# Patient Record
Sex: Male | Born: 1965 | Race: White | Hispanic: Yes | Marital: Married | Smoking: Current every day smoker
Health system: Southern US, Community
[De-identification: ages and names within clinical notes are randomized; demographics above are authoritative.]

## PROBLEM LIST (undated history)

## (undated) DIAGNOSIS — M5431 Sciatica, right side: Secondary | ICD-10-CM

## (undated) DIAGNOSIS — E119 Type 2 diabetes mellitus without complications: Secondary | ICD-10-CM

## (undated) DIAGNOSIS — E669 Obesity, unspecified: Secondary | ICD-10-CM

## (undated) DIAGNOSIS — E785 Hyperlipidemia, unspecified: Secondary | ICD-10-CM

## (undated) HISTORY — DX: Obesity, unspecified: E66.9

## (undated) HISTORY — DX: Hyperlipidemia, unspecified: E78.5

## (undated) HISTORY — PX: OTHER SURGICAL HISTORY: SHX169

## (undated) HISTORY — PX: APPENDECTOMY: SHX54

## (undated) HISTORY — PX: VASECTOMY: SHX75

## (undated) HISTORY — DX: Sciatica, right side: M54.31

---

## 2005-02-07 ENCOUNTER — Emergency Department (HOSPITAL_COMMUNITY): Admission: EM | Admit: 2005-02-07 | Discharge: 2005-02-07 | Payer: Self-pay | Admitting: Emergency Medicine

## 2005-07-21 ENCOUNTER — Emergency Department (HOSPITAL_COMMUNITY): Admission: EM | Admit: 2005-07-21 | Discharge: 2005-07-21 | Payer: Self-pay | Admitting: Emergency Medicine

## 2005-08-03 ENCOUNTER — Emergency Department (HOSPITAL_COMMUNITY): Admission: EM | Admit: 2005-08-03 | Discharge: 2005-08-03 | Payer: Self-pay | Admitting: Emergency Medicine

## 2005-08-13 ENCOUNTER — Emergency Department (HOSPITAL_COMMUNITY): Admission: EM | Admit: 2005-08-13 | Discharge: 2005-08-13 | Payer: Self-pay | Admitting: Emergency Medicine

## 2008-02-05 ENCOUNTER — Encounter: Admission: RE | Admit: 2008-02-05 | Discharge: 2008-02-05 | Payer: Self-pay | Admitting: Internal Medicine

## 2011-04-30 ENCOUNTER — Emergency Department (HOSPITAL_COMMUNITY)
Admission: EM | Admit: 2011-04-30 | Discharge: 2011-04-30 | Disposition: A | Discharge status: Home / Self Care | Payer: 59 | Attending: Emergency Medicine | Admitting: Emergency Medicine

## 2011-04-30 ENCOUNTER — Encounter (HOSPITAL_COMMUNITY): Payer: Self-pay

## 2011-04-30 ENCOUNTER — Emergency Department (HOSPITAL_COMMUNITY): Payer: 59

## 2011-04-30 DIAGNOSIS — R109 Unspecified abdominal pain: Secondary | ICD-10-CM | POA: Insufficient documentation

## 2011-04-30 DIAGNOSIS — R112 Nausea with vomiting, unspecified: Secondary | ICD-10-CM | POA: Insufficient documentation

## 2011-04-30 DIAGNOSIS — R319 Hematuria, unspecified: Secondary | ICD-10-CM | POA: Insufficient documentation

## 2011-04-30 DIAGNOSIS — N23 Unspecified renal colic: Secondary | ICD-10-CM | POA: Insufficient documentation

## 2011-04-30 LAB — URINALYSIS, ROUTINE W REFLEX MICROSCOPIC
Glucose, UA: NEGATIVE mg/dL
Leukocytes, UA: NEGATIVE
Specific Gravity, Urine: 1.023 (ref 1.005–1.030)
Urobilinogen, UA: 0.2 mg/dL (ref 0.0–1.0)

## 2011-04-30 LAB — URINE MICROSCOPIC-ADD ON

## 2011-04-30 MED ORDER — TAMSULOSIN HCL 0.4 MG PO CAPS: 0.4000 mg | ORAL_CAPSULE | Freq: Every day | ORAL | Status: DC

## 2011-04-30 MED ORDER — HYDROMORPHONE HCL PF 1 MG/ML IJ SOLN
1.0000 mg | Freq: Once | INTRAMUSCULAR | Status: AC
  Administered 2011-04-30: 1 mg via INTRAVENOUS
  Filled 2011-04-30: qty 1

## 2011-04-30 MED ORDER — KETOROLAC TROMETHAMINE 30 MG/ML IJ SOLN
INTRAMUSCULAR | Status: AC
  Administered 2011-04-30: 30 mg
  Filled 2011-04-30: qty 1

## 2011-04-30 MED ORDER — KETOROLAC TROMETHAMINE 30 MG/ML IJ SOLN
30.0000 mg | Freq: Once | INTRAMUSCULAR | Status: AC
  Administered 2011-04-30: 30 mg via INTRAVENOUS
  Filled 2011-04-30: qty 1

## 2011-04-30 MED ORDER — SODIUM CHLORIDE 0.9 % IV BOLUS (SEPSIS)
1000.0000 mL | Freq: Once | INTRAVENOUS | Status: AC
  Administered 2011-04-30: 1000 mL via INTRAVENOUS

## 2011-04-30 MED ORDER — ONDANSETRON HCL 4 MG/2ML IJ SOLN
4.0000 mg | Freq: Once | INTRAMUSCULAR | Status: AC
  Administered 2011-04-30: 4 mg via INTRAVENOUS
  Filled 2011-04-30: qty 2

## 2011-04-30 MED ORDER — ONDANSETRON HCL 4 MG PO TABS: 4.0000 mg | ORAL_TABLET | Freq: Four times a day (QID) | ORAL | Status: AC

## 2011-04-30 MED ORDER — OXYCODONE-ACETAMINOPHEN 5-325 MG PO TABS: 2.0000 | ORAL_TABLET | ORAL | Status: AC | PRN

## 2011-04-30 NOTE — ED Notes (Signed)
Pt. c/o LLQ pain and nausea that started yesterday AM . Pt. Denies Vomiting or diarrhea

## 2011-04-30 NOTE — ED Notes (Signed)
Pt alert and oriented x4. Respirations even and unlabored, bilateral symmetrical rise and fall of chest. Skin warm and dry. In no acute distress. Denies needs. Pt resting with eyes closed and lights off.

## 2011-04-30 NOTE — ED Provider Notes (Signed)
History     CSN: [id number]  Arrival date & time 04/30/11  7829   First MD Initiated Contact with Patient 04/30/11 0757      Chief Complaint  Patient presents with  . Abdominal Pain  . Nephrolithiasis    (Consider location/radiation/quality/duration/timing/severity/associated sxs/prior treatment) HPI  Patient with history of kidney stones.  STates blood in urine for 4-5 days.  Then pain left flank began yesterday.  Increased to 10 this a.m. With sharp cramps like prior kidney stone.  Radiates to llq.  Nausea and vomiting.  No fever or chills.  History of four prior stones with similar symptoms.    History reviewed. No pertinent past medical history. Kidney stones History reviewed. No pertinent past surgical history.  Family History  Problem Relation Age of Onset  . Diabetes Mother   . Hypertension Mother     History  Substance Use Topics  . Smoking status: Current Everyday Smoker -- 0.5 packs/day  . Smokeless tobacco: Not on file  . Alcohol Use: No      Review of Systems  All other systems reviewed and are negative.    Allergies  Review of patient's allergies indicates no known allergies.  Home Medications   Current Outpatient Rx  Name Route Sig Dispense Refill  . ADULT MULTIVITAMIN W/MINERALS CH Oral Take 1 tablet by mouth daily.      BP 120/71  Pulse 71  Temp(Src) 98.1 F (36.7 C) (Oral)  Wt 240 lb (108.863 kg)  SpO2 97%  Physical Exam  Nursing note and vitals reviewed. Constitutional: He is oriented to person, place, and time. He appears well-developed and well-nourished.  HENT:  Head: Normocephalic and atraumatic.  Right Ear: External ear normal.  Left Ear: External ear normal.  Nose: Nose normal.  Mouth/Throat: Oropharynx is clear and moist.  Eyes: Conjunctivae and EOM are normal. Pupils are equal, round, and reactive to light.  Neck: Normal range of motion. Neck supple.  Cardiovascular: Normal rate and regular rhythm.   Pulmonary/Chest:  Effort normal and breath sounds normal.  Abdominal: Soft. Bowel sounds are normal.  Musculoskeletal: Normal range of motion.  Neurological: He is alert and oriented to person, place, and time.  Skin: Skin is warm and dry.  Psychiatric: He has a normal mood and affect.    ED Course  Procedures (including critical care time)   Labs Reviewed  URINALYSIS, ROUTINE W REFLEX MICROSCOPIC   Dg Abd 1 View  04/30/2011  *RADIOLOGY REPORT*  Clinical Data: Left lower quadrant pain.  ABDOMEN - 1 VIEW  Comparison: 08/13/2005 and CT from 07/21/2005  Findings: Supine images of the abdomen were obtained.  There is a nonobstructive bowel gas pattern with stool predominately in the right and transverse colon.  No large abdominal calcifications.  No gross bony abnormality.  Cannot evaluate for free air on these supine images.  IMPRESSION: Nonobstructive bowel gas pattern.  Original Report Authenticated By: Richarda Overlie, M.D.     No diagnosis found.    MDM  Patient with 2 mm distal left UVJ stone. He has pain control here. He will be given antibiotics Flomax and pain medicine and instructed to follow urology        Hilario Quarry, MD 04/30/11 1406

## 2012-03-31 ENCOUNTER — Emergency Department (HOSPITAL_COMMUNITY): Payer: 59

## 2012-03-31 ENCOUNTER — Encounter (HOSPITAL_COMMUNITY): Payer: Self-pay | Admitting: *Deleted

## 2012-03-31 ENCOUNTER — Emergency Department (HOSPITAL_COMMUNITY)
Admission: EM | Admit: 2012-03-31 | Discharge: 2012-03-31 | Disposition: A | Discharge status: Home / Self Care | Payer: 59 | Attending: Emergency Medicine | Admitting: Emergency Medicine

## 2012-03-31 DIAGNOSIS — R0789 Other chest pain: Secondary | ICD-10-CM | POA: Insufficient documentation

## 2012-03-31 DIAGNOSIS — R002 Palpitations: Secondary | ICD-10-CM | POA: Insufficient documentation

## 2012-03-31 DIAGNOSIS — E119 Type 2 diabetes mellitus without complications: Secondary | ICD-10-CM | POA: Insufficient documentation

## 2012-03-31 DIAGNOSIS — F17200 Nicotine dependence, unspecified, uncomplicated: Secondary | ICD-10-CM | POA: Insufficient documentation

## 2012-03-31 DIAGNOSIS — R079 Chest pain, unspecified: Secondary | ICD-10-CM

## 2012-03-31 HISTORY — DX: Type 2 diabetes mellitus without complications: E11.9

## 2012-03-31 LAB — CBC WITH DIFFERENTIAL/PLATELET
Eosinophils Absolute: 0.3 10*3/uL (ref 0.0–0.7)
Eosinophils Relative: 3 % (ref 0–5)
HCT: 43.9 % (ref 39.0–52.0)
Lymphocytes Relative: 24 % (ref 12–46)
Lymphs Abs: 2 10*3/uL (ref 0.7–4.0)
MCHC: 36 g/dL (ref 30.0–36.0)
Monocytes Absolute: 0.6 10*3/uL (ref 0.1–1.0)
Monocytes Relative: 7 % (ref 3–12)
Neutrophils Relative %: 65 % (ref 43–77)
Platelets: 223 10*3/uL (ref 150–400)

## 2012-03-31 LAB — BASIC METABOLIC PANEL
CO2: 24 mEq/L (ref 19–32)
Chloride: 101 mEq/L (ref 96–112)
Creatinine, Ser: 0.61 mg/dL (ref 0.50–1.35)
GFR calc non Af Amer: >90 mL/min (ref >90)
Glucose, Bld: 229 mg/dL — ABNORMAL HIGH (ref 70–99)

## 2012-03-31 LAB — TROPONIN I: Troponin I: <0.3 ng/mL (ref <0.30)

## 2012-03-31 MED ORDER — ACETAMINOPHEN 325 MG PO TABS
650.0000 mg | ORAL_TABLET | Freq: Once | ORAL | Status: AC
  Administered 2012-03-31: 650 mg via ORAL
  Filled 2012-03-31: qty 2

## 2012-03-31 MED ORDER — TRAMADOL HCL 50 MG PO TABS: 50.0000 mg | ORAL_TABLET | Freq: Four times a day (QID) | ORAL | Status: DC | PRN

## 2012-03-31 MED ORDER — SODIUM CHLORIDE 0.9 % IV SOLN
Freq: Once | INTRAVENOUS | Status: AC
  Administered 2012-03-31: 20 mL/h via INTRAVENOUS

## 2012-03-31 NOTE — ED Provider Notes (Signed)
Medical screening examination/treatment/procedure(s) were performed by non-physician practitioner and as supervising physician I was immediately available for consultation/collaboration.  Flint Melter, MD 03/31/12 [phone]

## 2012-03-31 NOTE — ED Notes (Signed)
Pt reports had big argument at work yesterday morning-afterwards began to have some tightness in chest. States has had 3-4 episodes where he felt like heart was racing since yesterday. States pain central chest- feels tight. Denies shortness of breath/nausea/vomiting. Has had some diaphoresis at times.

## 2012-03-31 NOTE — ED Notes (Signed)
Pt sent from [facility] for eval where he went this am d/t chest pain. Per pt EKG was done at urgent care and he was told it was borderline. Pt sts he was given aspirin at urgent care. Pt c/o centralized chest pain that feels like heaviness and pressure and gets  worse with deep breath.

## 2012-03-31 NOTE — ED Provider Notes (Signed)
History     CSN: [id number]  Arrival date & time 03/31/12  1157   First MD Initiated Contact with Patient 03/31/12 1229      Chief Complaint  Patient presents with  . Chest Pain    (Consider location/radiation/quality/duration/timing/severity/associated sxs/prior treatment) Patient is a 46 y.o. male presenting with chest pain. The history is provided by the patient.  Chest Pain The chest pain began yesterday. Chest pain occurs constantly. The chest pain is unchanged. The pain is associated with breathing and exertion. The quality of the pain is described as heavy. The pain radiates to the left shoulder. Chest pain is worsened by certain positions, deep breathing and exertion. Primary symptoms include palpitations. Pertinent negatives for primary symptoms include no fever, no cough, no abdominal pain and no nausea.  Pertinent negatives for associated symptoms include no lower extremity edema and no weakness. Associated symptoms comments: He describes left sided chest heaviness since yesterday that gets worse with movement and deep breathing but does not completely resolve at any time. No fever or cough. He denies SOB but also that he feels he is having to breathe harder. No nausea or vomiting. He denies previous cardiology evaluation.Marland Kitchen He tried nothing for the symptoms.  His past medical history is significant for diabetes.  His family medical history is significant for CAD in family, hyperlipidemia in family and hypertension in family.     Past Medical History  Diagnosis Date  . Diabetes mellitus without complication     diet controlled    History reviewed. No pertinent past surgical history.  Family History  Problem Relation Age of Onset  . Diabetes Mother   . Hypertension Mother     History  Substance Use Topics  . Smoking status: Current Every Day Smoker -- 0.5 packs/day  . Smokeless tobacco: Not on file  . Alcohol Use: No      Review of Systems  Constitutional:  Negative for fever and chills.  HENT: Negative.   Respiratory: Negative.  Negative for cough.   Cardiovascular: Positive for chest pain and palpitations.  Gastrointestinal: Negative.  Negative for nausea and abdominal pain.  Musculoskeletal: Negative.   Skin: Negative.   Neurological: Negative.  Negative for weakness.    Allergies  Review of patient's allergies indicates no known allergies.  Home Medications   Current Outpatient Rx  Name  Route  Sig  Dispense  Refill  . IBUPROFEN 200 MG PO TABS   Oral   Take 600 mg by mouth once. pain         . ADULT MULTIVITAMIN W/MINERALS CH   Oral   Take 1 tablet by mouth daily.         Marland Kitchen TAMSULOSIN HCL 0.4 MG PO CAPS   Oral   Take 0.4 mg by mouth daily.         Marland Kitchen VITAMIN C 500 MG PO TABS   Oral   Take 500 mg by mouth daily.           BP 147/92  Pulse 77  Temp 98.2 F (36.8 C) (Oral)  Resp 16  SpO2 100%  Physical Exam  Constitutional: He is oriented to person, place, and time. He appears well-developed and well-nourished. No distress.  HENT:  Head: Normocephalic.  Mouth/Throat: Oropharynx is clear and moist.  Neck: Normal range of motion.  Cardiovascular: Normal rate and regular rhythm.   No murmur heard. Pulmonary/Chest: Effort normal. He has no wheezes. He has no rales. He exhibits no tenderness.  Abdominal: Soft. There is no tenderness. There is no rebound and no guarding.  Musculoskeletal: Normal range of motion. He exhibits no edema.  Neurological: He is alert and oriented to person, place, and time.  Skin: Skin is warm and dry. No rash noted.    ED Course  Procedures (including critical care time)   Labs Reviewed  CBC WITH DIFFERENTIAL  BASIC METABOLIC PANEL  TROPONIN I   Results for orders placed during the hospital encounter of 03/31/12  CBC WITH DIFFERENTIAL      Component Value Range   WBC 8.2  4.0 - 10.5 K/uL   RBC 5.07  4.22 - 5.81 MIL/uL   Hemoglobin 15.8  13.0 - 17.0 g/dL   HCT 04.5   40.9 - 81.1 %   MCV 86.6  78.0 - 100.0 fL   MCH 31.2  26.0 - 34.0 pg   MCHC 36.0  30.0 - 36.0 g/dL   RDW 91.4  78.2 - 95.6 %   Platelets 223  150 - 400 K/uL   Neutrophils Relative 65  43 - 77 %   Neutro Abs 5.3  1.7 - 7.7 K/uL   Lymphocytes Relative 24  12 - 46 %   Lymphs Abs 2.0  0.7 - 4.0 K/uL   Monocytes Relative 7  3 - 12 %   Monocytes Absolute 0.6  0.1 - 1.0 K/uL   Eosinophils Relative 3  0 - 5 %   Eosinophils Absolute 0.3  0.0 - 0.7 K/uL   Basophils Relative 1  0 - 1 %   Basophils Absolute 0.1  0.0 - 0.1 K/uL  BASIC METABOLIC PANEL      Component Value Range   Sodium 135  135 - 145 mEq/L   Potassium 4.1  3.5 - 5.1 mEq/L   Chloride 101  96 - 112 mEq/L   CO2 24  19 - 32 mEq/L   Glucose, Bld 229 (*) 70 - 99 mg/dL   BUN 13  6 - 23 mg/dL   Creatinine, Ser 2.13  0.50 - 1.35 mg/dL   Calcium 9.3  8.4 - 08.6 mg/dL   GFR calc non Af Amer >90  >90 mL/min   GFR calc Af Amer >90  >90 mL/min  TROPONIN I      Component Value Range   Troponin I <0.30  <0.30 ng/mL  TROPONIN I      Component Value Range   Troponin I <0.30  <0.30 ng/mL   Dg Chest 2 View  03/31/2012  *RADIOLOGY REPORT*  Clinical Data: Chest pain.  CHEST - 2 VIEW  Comparison: None.  Findings: Mild interstitial prominence.  No confluent airspace opacity.  No pleural effusion or pneumothorax.  Cardiomediastinal contours within normal range. No acute osseous finding.  IMPRESSION: Mild interstitial prominence may be chronic versus an atypical or viral infection.   Original Report Authenticated By: Jearld Lesch, M.D.   No results found.  Date: 03/31/2012  Rate: 77  Rhythm: normal sinus rhythm  QRS Axis: left  Intervals: normal  ST/T Wave abnormalities: normal  Conduction Disutrbances:none  Narrative Interpretation:   Old EKG Reviewed: none available    No diagnosis found. 1. Chest pain   MDM  The patient has been comfortable while in ED. Discomfort still present with movement. No change. Doubt ACS - normal  multiple troponins, nonacute EKG. Stable for discharge.         Rodena Medin, PA-C 03/31/12 1700

## 2012-03-31 NOTE — Discharge Instructions (Signed)
Your lab studies and x-ray are essentially normal, without any evidence to support a heart attack. You can be discharged home and should follow up with your doctor for further evaluation if symptoms persist. Recommend ibuprofen for discomfort, warm compresses. Return here with any severe pain or shortness of breath, high fever, or new concerns.   Chest Pain (Nonspecific) It is often hard to give a specific diagnosis for the cause of chest pain. There is always a chance that your pain could be related to something serious, such as a heart attack or a blood clot in the lungs. You need to follow up with your caregiver for further evaluation. CAUSES   Heartburn.  Pneumonia or bronchitis.  Anxiety or stress.  Inflammation around your heart (pericarditis) or lung (pleuritis or pleurisy).  A blood clot in the lung.  A collapsed lung (pneumothorax). It can develop suddenly on its own (spontaneous pneumothorax) or from injury (trauma) to the chest.  Shingles infection (herpes zoster virus). The chest wall is composed of bones, muscles, and cartilage. Any of these can be the source of the pain.  The bones can be bruised by injury.  The muscles or cartilage can be strained by coughing or overwork.  The cartilage can be affected by inflammation and become sore (costochondritis). DIAGNOSIS  Lab tests or other studies, such as X-rays, electrocardiography, stress testing, or cardiac imaging, may be needed to find the cause of your pain.  TREATMENT   Treatment depends on what may be causing your chest pain. Treatment may include:  Acid blockers for heartburn.  Anti-inflammatory medicine.  Pain medicine for inflammatory conditions.  Antibiotics if an infection is present.  You may be advised to change lifestyle habits. This includes stopping smoking and avoiding alcohol, caffeine, and chocolate.  You may be advised to keep your head raised (elevated) when sleeping. This reduces the chance of  acid going backward from your stomach into your esophagus.  Most of the time, nonspecific chest pain will improve within 2 to 3 days with rest and mild pain medicine. HOME CARE INSTRUCTIONS   If antibiotics were prescribed, take your antibiotics as directed. Finish them even if you start to feel better.  For the next few days, avoid physical activities that bring on chest pain. Continue physical activities as directed.  Do not smoke.  Avoid drinking alcohol.  Only take over-the-counter or prescription medicine for pain, discomfort, or fever as directed by your caregiver.  Follow your caregiver's suggestions for further testing if your chest pain does not go away.  Keep any follow-up appointments you made. If you do not go to an appointment, you could develop lasting (chronic) problems with pain. If there is any problem keeping an appointment, you must call to reschedule. SEEK MEDICAL CARE IF:   You think you are having problems from the medicine you are taking. Read your medicine instructions carefully.  Your chest pain does not go away, even after treatment.  You develop a rash with blisters on your chest. SEEK IMMEDIATE MEDICAL CARE IF:   You have increased chest pain or pain that spreads to your arm, neck, jaw, back, or abdomen.  You develop shortness of breath, an increasing cough, or you are coughing up blood.  You have severe back or abdominal pain, feel nauseous, or vomit.  You develop severe weakness, fainting, or chills.  You have a fever. THIS IS AN EMERGENCY. Do not wait to see if the pain will go away. Get medical help at once.  Call your local emergency services (911 in [place] Do not drive yourself to the hospital. MAKE SURE YOU:   Understand these instructions.  Will watch your condition.  Will get help right away if you are not doing well or get worse. Document Released: 01/13/2005 Document Revised: 06/28/2011 Document Reviewed: 11/09/2007 [facility] Patient  Information 2013 [place], [place].

## 2014-07-03 ENCOUNTER — Other Ambulatory Visit: Payer: Self-pay | Admitting: Internal Medicine

## 2014-07-03 DIAGNOSIS — M545 Low back pain: Secondary | ICD-10-CM

## 2014-07-03 DIAGNOSIS — M5412 Radiculopathy, cervical region: Secondary | ICD-10-CM

## 2014-07-10 ENCOUNTER — Ambulatory Visit
Admission: RE | Admit: 2014-07-10 | Discharge: 2014-07-10 | Disposition: A | Discharge status: Home / Self Care | Payer: 59 | Source: Ambulatory Visit | Attending: Internal Medicine | Admitting: Internal Medicine

## 2014-07-10 DIAGNOSIS — M545 Low back pain: Secondary | ICD-10-CM

## 2014-07-10 DIAGNOSIS — M5412 Radiculopathy, cervical region: Secondary | ICD-10-CM

## 2014-07-11 ENCOUNTER — Other Ambulatory Visit: Payer: Self-pay | Admitting: Internal Medicine

## 2014-07-11 DIAGNOSIS — I6509 Occlusion and stenosis of unspecified vertebral artery: Secondary | ICD-10-CM

## 2014-07-18 ENCOUNTER — Ambulatory Visit
Admission: RE | Admit: 2014-07-18 | Discharge: 2014-07-18 | Disposition: A | Discharge status: Home / Self Care | Payer: 59 | Source: Ambulatory Visit | Attending: Internal Medicine | Admitting: Internal Medicine

## 2014-07-18 DIAGNOSIS — I6509 Occlusion and stenosis of unspecified vertebral artery: Secondary | ICD-10-CM

## 2014-07-18 MED ORDER — IOPAMIDOL (ISOVUE-370) INJECTION 76%
80.0000 mL | Freq: Once | INTRAVENOUS | Status: AC | PRN
  Administered 2014-07-18: 80 mL via INTRAVENOUS

## 2014-11-28 ENCOUNTER — Encounter: Payer: Self-pay | Admitting: Neurology

## 2014-11-28 ENCOUNTER — Ambulatory Visit (INDEPENDENT_AMBULATORY_CARE_PROVIDER_SITE_OTHER): Payer: 59 | Admitting: Neurology

## 2014-11-28 VITALS — BP 138/90 | HR 86 | Resp 20 | Ht 68.0 in | Wt 230.0 lb

## 2014-11-28 DIAGNOSIS — R0683 Snoring: Secondary | ICD-10-CM

## 2014-11-28 DIAGNOSIS — G4710 Hypersomnia, unspecified: Secondary | ICD-10-CM

## 2014-11-28 DIAGNOSIS — G4730 Sleep apnea, unspecified: Secondary | ICD-10-CM

## 2014-11-28 NOTE — Patient Instructions (Signed)
Sleep Apnea  Sleep apnea is disorder that affects a person's sleep. A person with sleep apnea has abnormal pauses in their breathing when they sleep. It is hard for them to get a good sleep. This makes a person tired during the day. It also can lead to other physical problems. There are three types of sleep apnea. One type is when breathing stops for a short time because your airway is blocked (obstructive sleep apnea). Another type is when the brain sometimes fails to give the normal signal to breathe to the muscles that control your breathing (central sleep apnea). The third type is a combination of the other two types.  HOME CARE  · Do not sleep on your back. Try to sleep on your side.  · Take all medicine as told by your doctor.  · Avoid alcohol, calming medicines (sedatives), and depressant drugs.  · Try to lose weight if you are overweight. Talk to your doctor about a healthy weight goal.  Your doctor may have you use a device that helps to open your airway. It can help you get the air that you need. It is called a positive airway pressure (PAP) device. There are three types of PAP devices:  · Continuous positive airway pressure (CPAP) device.  · Nasal expiratory positive airway pressure (EPAP) device.  · Bilevel positive airway pressure (BPAP) device.  MAKE SURE YOU:  · Understand these instructions.  · Will watch your condition.  · Will get help right away if you are not doing well or get worse.  Document Released: 01/13/2008 Document Revised: 03/22/2012 Document Reviewed: 08/07/2011  [employer]® Patient Information ©2015 [employer]. This information is not intended to replace advice given to you by your health care provider. Make sure you discuss any questions you have with your health care provider.

## 2014-11-28 NOTE — Progress Notes (Signed)
SLEEP MEDICINE CLINIC   Provider:  Melvyn Novas, M D  Referring Provider: Martha Clan, MD Primary Care Physician:  Martha Clan, MD  Chief Complaint  Patient presents with  . sleep consult    snoring, excessive sleepiness during day, wife says that pt stops breathing, rm 10, alone    HPI:  Jeffrey Welch is a [place]  49 y.o. male , seen here as a referral from Dr. Clelia Croft for a sleep evaluation ,  Chief complaint according to patient : " i am excessively sleepy"  Jeffrey Welch reports that his spouse has observed him to snore very loudly and she has witnessed apneas. Sometimes in the morning when he wakes up his mouth is parched and dry and occasionally at night he wakes up with a choking sensation as if he swallows his tongue. He has never before been evaluated for sleep apnea. He has been gaining and losing weight over the last 6 months he lost about 25 pounds but he has been heavier than he has been 5 years ago. Jeffrey Welch describes his sleep habits as follows: He usually goes to bed around 11 PM, he states he falls asleep like a rock anywhere and so does promptly at night. But he wakes up frequently from sleep and his sleep is fragmented not restorative and not refreshing. His bedroom is cool, quiet and dark and he shares that with his wife who has observed his apneas. He likes to sleep on his side which also seems to reduce his snoring and frequency of apneas. He usually has no need to go to the bathroom at night. He rises in the morning around 6 AM and wakes up spontaneously. He feels tired not refreshed not restored even after 7 hours of sleep. Most nights he will sleep much less.  In the morning he will drink some coffee and during the day will add another 8 cups. He drinks Bustello. He does not plan any naps during daytime but he has fallen asleep when not stimulated or not physically active. He has trouble not falling asleep fighting not falling asleep when he sits  at the computer. His work is office-based, he reads and works on [employer], and is deprived of natural daylight. He feels naps are not helpful, he wouldn't sleep only 15 or 30 minutes.  He has tried memory foam mattresses , pillows,  wedges but he cannot get a good night of restful sounds sleep.    Sleep medical history and family sleep history: His father and mother are snores. No diagnosis of apnea.   Social history: daytime jobs, office , same shift for a couple of years.  He is a smoker, and caffeine drinker, he drinks socially alcohol. 2-3 glasses a month.   Review of Systems: Out of a complete 14 system review, the patient complains of only the following symptoms, and all other reviewed systems are negative. Snoring, weight gain, witnessed apneas, Epworth sleepiness score is endorsed at 17 points, fatigue severity score not endorsed,   Social History   Social History  . Marital Status: Married    Spouse Name: N/A  . Number of Children: N/A  . Years of Education: N/A   Occupational History  . Not on file.   Social History Main Topics  . Smoking status: Current Every Day Smoker -- 0.25 packs/day for 25 years    Types: Cigarettes  . Smokeless tobacco: Not on file  . Alcohol Use: 0.0 oz/week    0 Standard  drinks or equivalent per week     Comment: rarely  . Drug Use: No  . Sexual Activity: Not on file   Other Topics Concern  . Not on file   Social History Narrative   Drinks 9 cups of coffee daily.    Family History  Problem Relation Age of Onset  . Diabetes Mother   . Hypertension Mother     Past Medical History  Diagnosis Date  . Diabetes mellitus without complication     diet controlled  . Hyperlipidemia   . Obesity   . Sciatica of right side     Past Surgical History  Procedure Laterality Date  . Vasectomy    . Appendectomy    . Anterior cervical surgery      Current Outpatient Prescriptions  Medication Sig Dispense Refill  . aspirin 81 MG  tablet Take 81 mg by mouth daily.    . cyclobenzaprine (FLEXERIL) 10 MG tablet Take 10 mg by mouth 3 (three) times daily as needed for muscle spasms.    Marland Kitchen HYDROcodone-acetaminophen (NORCO/VICODIN) 5-325 MG per tablet Take 1 tablet by mouth every 6 (six) hours as needed for moderate pain.    Marland Kitchen ibuprofen (ADVIL,MOTRIN) 200 MG tablet Take 600 mg by mouth once. pain    . metFORMIN (GLUCOPHAGE) 1000 MG tablet Take 1,000 mg by mouth 2 (two) times daily with a meal.    . Multiple Vitamin (MULITIVITAMIN WITH MINERALS) TABS Take 1 tablet by mouth daily.    . simvastatin (ZOCOR) 20 MG tablet Take 20 mg by mouth daily.    . Tamsulosin HCl (FLOMAX) 0.4 MG CAPS Take 0.4 mg by mouth daily.    . traMADol (ULTRAM) 50 MG tablet Take 1 tablet (50 mg total) by mouth every 6 (six) hours as needed for pain. 15 tablet 0  . vitamin C (ASCORBIC ACID) 500 MG tablet Take 500 mg by mouth daily.     No current facility-administered medications for this visit.    Allergies as of 11/28/2014  . (No Known Allergies)    Vitals: BP 138/90 mmHg  Pulse 86  Resp 20  Ht  (1.727 m)  Wt 230 lb (104.327 kg)  BMI 34.98 kg/m2 Last Weight:  Wt Readings from Last 1 Encounters:  11/28/14 230 lb (104.327 kg)   ZOX:WRUE mass index is 34.98 kg/(m^2).     Last Height:   Ht Readings from Last 1 Encounters:  11/28/14  (1.727 m)    Physical exam:  General: The patient is awake, alert and appears not in acute distress. The patient is well groomed. Head: Normocephalic, atraumatic. Neck is supple. Mallampati 3 with an elongated and swollen uvula. ,  neck circumference: 18 .  Anterior fusion scar is noted on the neck but is well healing, surgery occurred on 09/04/2014. Dr. Jeral Fruit   Nasal airflow unrestricted, TMJ click is evident . Retrognathia is not seen.  Cardiovascular:  Regular rate and rhythm, without  murmurs or carotid bruit, and without distended neck veins. Respiratory: Lungs are clear to auscultation. Skin:   Without evidence of edema, or rash Trunk: BMI is elevated . The patient's posture is normal, he keeps his left elbow flexed.  Neurologic exam : The patient is awake and alert, oriented to place and time.   Memory subjective described as intact.  Attention span & concentration ability appears normal.  Speech is fluent,  without dysarthria, dysphonia or aphasia.  Mood and affect are appropriate.  Cranial nerves: Pupils are equal  and briskly reactive to light. Funduscopic exam without  evidence of pallor or edema.  Extraocular movements  in vertical and horizontal planes intact and without nystagmus. Visual fields by finger perimetry are intact. Hearing to finger rub intact. Facial sensation intact to fine touch. Facial motor strength is symmetric and tongue and uvula move midline. Shoulder shrug was symmetrical.   Motor exam: Normal tone, muscle bulk , but loss of  strength in left upper extremity.  Elbow pain, status post cortisone shot. ROM restricted   Sensory:  Fine touch, pinprick and vibration were tested in all extremities.  Proprioception tested in the upper extremities was normal.  Coordination: Rapid alternating movements in the fingers/hands was normal.  Finger-to-nose maneuver  normal without evidence of ataxia, dysmetria or tremor.  Gait and station: Patient walks without assistive device and is able unassisted to climb up to the exam table. Strength within normal limits.  Stance is stable and normal. Toe and hell stand were tested.  Deep tendon reflexes: in the upper and lower extremities are symmetric and intact. Babinski maneuver response is downgoing.  The patient was advised of the nature of the diagnosed sleep disorder , the treatment options and risks for general a health and wellness arising from not treating the condition.  I spent more than 35 minutes of face to face time with the patient. Greater than 50% of time was spent in counseling and coordination of care. We  have discussed the diagnosis and differential and I answered the patient's questions.     Assessment:  After physical and neurologic examination, review of laboratory studies,  Personal review of imaging studies, reports of other /same  Imaging studies ,  Results of polysomnography/ neurophysiology testing and pre-existing records as far as provided in visit., my assessment is   1) Jeffrey Welch has all the risk factors for obstructive sleep apnea including the body mass index a shorter thicker neck, and elongated uvula and the clinical observation of snoring and apnea being witnessed.  2) the main risk factor he can influence his weight, he has been losing weight over the last 6 months or so and hopes to continue this.  3) his recent anterior cervical fusion performed by Dr. Jeral Fruit was managed to help him with left upper extremity strength grip strengths and range of motion as well as neck tightness. The patient has no swallowing difficulties since his surgery he also states that he is not yet feeling a return to normal strength on his left upper extremity. I would recommend for him to sleep in lateral recumbent position instead of supine if tolerated. He may have to resume this on the right shoulder since the left elbow and shoulder are in pain.      Plan:  Treatment plan and additional workup : Split night polysomnography ordered with an AHI of 20 and a desaturation score of 4%. The patient feels no high per fatigue and he does not have morning headaches. We will not need hypercapnia measurements. Rv after sleep study with me. 30 minutes , please.   CC Dr Wayne Both Jaysten Essner MD  11/28/2014   CC: Martha Clan, Md [address] [place], [place] [address]

## 2015-01-19 ENCOUNTER — Ambulatory Visit (INDEPENDENT_AMBULATORY_CARE_PROVIDER_SITE_OTHER): Payer: 59 | Admitting: Neurology

## 2015-01-19 DIAGNOSIS — G4710 Hypersomnia, unspecified: Secondary | ICD-10-CM | POA: Diagnosis not present

## 2015-01-19 DIAGNOSIS — R0683 Snoring: Secondary | ICD-10-CM

## 2015-01-19 DIAGNOSIS — G4730 Sleep apnea, unspecified: Secondary | ICD-10-CM | POA: Diagnosis not present

## 2015-01-20 NOTE — Sleep Study (Signed)
Please see the scanned sleep study interpretation located in the Procedure tab within the Chart Review section. 

## 2015-01-21 ENCOUNTER — Telehealth: Payer: Self-pay

## 2015-01-21 NOTE — Telephone Encounter (Signed)
Called pt to give his sleep study results. Left message asking him to call me back.

## 2015-01-22 NOTE — Telephone Encounter (Signed)
Spoke to pt regarding his sleep study results. Advised pt that his study revealed only snoring and that therapies for snoring include an oral appliance or an ENT evaluation/procedure. I advised weight loss and positional therapy as well.  I also advised pt that there was significant periodic limb movements of sleep results in significant RLS or PLM disorder. I advised pt that I could make him an appt with Dr. Vickey Huger to discuss all of this information and treatment options. Pt declined an appt at this time. Pt said he would call back if he decided he wanted another appt. Pt wished for me to fax these results to Dr. Clelia Croft. Results faxed.

## 2015-08-18 ENCOUNTER — Encounter (HOSPITAL_COMMUNITY): Payer: Self-pay | Admitting: Internal Medicine

## 2015-08-18 ENCOUNTER — Telehealth: Payer: Self-pay | Admitting: Internal Medicine

## 2015-08-18 ENCOUNTER — Inpatient Hospital Stay (HOSPITAL_COMMUNITY): Payer: 59

## 2015-08-18 ENCOUNTER — Inpatient Hospital Stay (HOSPITAL_COMMUNITY)
Admission: AD | Admit: 2015-08-18 | Discharge: 2015-08-21 | DRG: 872 | Disposition: A | Discharge status: Home / Self Care | Payer: 59 | Source: Ambulatory Visit | Attending: Internal Medicine | Admitting: Internal Medicine

## 2015-08-18 DIAGNOSIS — E1165 Type 2 diabetes mellitus with hyperglycemia: Secondary | ICD-10-CM | POA: Diagnosis present

## 2015-08-18 DIAGNOSIS — A045 Campylobacter enteritis: Secondary | ICD-10-CM | POA: Diagnosis present

## 2015-08-18 DIAGNOSIS — R945 Abnormal results of liver function studies: Secondary | ICD-10-CM

## 2015-08-18 DIAGNOSIS — R509 Fever, unspecified: Secondary | ICD-10-CM | POA: Diagnosis present

## 2015-08-18 DIAGNOSIS — E876 Hypokalemia: Secondary | ICD-10-CM | POA: Diagnosis present

## 2015-08-18 DIAGNOSIS — W57XXXA Bitten or stung by nonvenomous insect and other nonvenomous arthropods, initial encounter: Secondary | ICD-10-CM | POA: Diagnosis present

## 2015-08-18 DIAGNOSIS — E869 Volume depletion, unspecified: Secondary | ICD-10-CM | POA: Diagnosis present

## 2015-08-18 DIAGNOSIS — Z7984 Long term (current) use of oral hypoglycemic drugs: Secondary | ICD-10-CM | POA: Diagnosis not present

## 2015-08-18 DIAGNOSIS — Z833 Family history of diabetes mellitus: Secondary | ICD-10-CM | POA: Diagnosis not present

## 2015-08-18 DIAGNOSIS — F17210 Nicotine dependence, cigarettes, uncomplicated: Secondary | ICD-10-CM | POA: Diagnosis present

## 2015-08-18 DIAGNOSIS — Z79899 Other long term (current) drug therapy: Secondary | ICD-10-CM | POA: Diagnosis not present

## 2015-08-18 DIAGNOSIS — E785 Hyperlipidemia, unspecified: Secondary | ICD-10-CM | POA: Diagnosis present

## 2015-08-18 DIAGNOSIS — Z8249 Family history of ischemic heart disease and other diseases of the circulatory system: Secondary | ICD-10-CM | POA: Diagnosis not present

## 2015-08-18 DIAGNOSIS — D696 Thrombocytopenia, unspecified: Secondary | ICD-10-CM | POA: Diagnosis present

## 2015-08-18 DIAGNOSIS — R6510 Systemic inflammatory response syndrome (SIRS) of non-infectious origin without acute organ dysfunction: Secondary | ICD-10-CM | POA: Diagnosis not present

## 2015-08-18 DIAGNOSIS — E871 Hypo-osmolality and hyponatremia: Secondary | ICD-10-CM | POA: Diagnosis present

## 2015-08-18 DIAGNOSIS — R7989 Other specified abnormal findings of blood chemistry: Secondary | ICD-10-CM | POA: Diagnosis present

## 2015-08-18 DIAGNOSIS — D6959 Other secondary thrombocytopenia: Secondary | ICD-10-CM | POA: Diagnosis present

## 2015-08-18 DIAGNOSIS — A419 Sepsis, unspecified organism: Principal | ICD-10-CM

## 2015-08-18 DIAGNOSIS — L539 Erythematous condition, unspecified: Secondary | ICD-10-CM | POA: Diagnosis present

## 2015-08-18 LAB — BASIC METABOLIC PANEL
Anion gap: 11 (ref 5–15)
BUN: 14 mg/dL (ref 6–20)
CHLORIDE: 91 mmol/L — AB (ref 101–111)
CO2: 27 mmol/L (ref 22–32)
Calcium: 8.8 mg/dL — ABNORMAL LOW (ref 8.9–10.3)
Creatinine, Ser: 1.02 mg/dL (ref 0.61–1.24)
GFR calc Af Amer: >60 mL/min (ref >60)
Glucose, Bld: 216 mg/dL — ABNORMAL HIGH (ref 65–99)
Potassium: 3.7 mmol/L (ref 3.5–5.1)
SODIUM: 129 mmol/L — AB (ref 135–145)

## 2015-08-18 LAB — CBC WITH DIFFERENTIAL/PLATELET
BASOS PCT: 2 %
Basophils Absolute: 0.2 10*3/uL — ABNORMAL HIGH (ref 0.0–0.1)
EOS ABS: 0 10*3/uL (ref 0.0–0.7)
Eosinophils Relative: 0 %
HCT: 43.8 % (ref 39.0–52.0)
Hemoglobin: 16.1 g/dL (ref 13.0–17.0)
Lymphocytes Relative: 20 %
Lymphs Abs: 1.6 10*3/uL (ref 0.7–4.0)
MCH: 31.1 pg (ref 26.0–34.0)
MCHC: 36.8 g/dL — ABNORMAL HIGH (ref 30.0–36.0)
MCV: 84.6 fL (ref 78.0–100.0)
MONO ABS: 0.9 10*3/uL (ref 0.1–1.0)
Monocytes Relative: 11 %
NEUTROS ABS: 5.3 10*3/uL (ref 1.7–7.7)
Neutrophils Relative %: 67 %
PLATELETS: 60 10*3/uL — AB (ref 150–400)
RBC: 5.18 MIL/uL (ref 4.22–5.81)
RDW: 13.2 % (ref 11.5–15.5)
WBC: 8 10*3/uL (ref 4.0–10.5)

## 2015-08-18 LAB — URINE MICROSCOPIC-ADD ON
BACTERIA UA: NONE SEEN
RBC / HPF: NONE SEEN RBC/hpf (ref 0–5)

## 2015-08-18 LAB — RAPID URINE DRUG SCREEN, HOSP PERFORMED
AMPHETAMINES: NOT DETECTED
BENZODIAZEPINES: POSITIVE — AB
Barbiturates: NOT DETECTED
COCAINE: NOT DETECTED
Opiates: NOT DETECTED
Tetrahydrocannabinol: POSITIVE — AB

## 2015-08-18 LAB — URINALYSIS, ROUTINE W REFLEX MICROSCOPIC
GLUCOSE, UA: NEGATIVE mg/dL
Hgb urine dipstick: NEGATIVE
KETONES UR: NEGATIVE mg/dL
LEUKOCYTES UA: NEGATIVE
NITRITE: NEGATIVE
PROTEIN: 30 mg/dL — AB
Specific Gravity, Urine: 1.025 (ref 1.005–1.030)
pH: 6 (ref 5.0–8.0)

## 2015-08-18 LAB — HEPATIC FUNCTION PANEL
ALBUMIN: 3.4 g/dL — AB (ref 3.5–5.0)
ALK PHOS: 83 U/L (ref 38–126)
ALT: 161 U/L — AB (ref 17–63)
AST: 115 U/L — ABNORMAL HIGH (ref 15–41)
BILIRUBIN TOTAL: 1.4 mg/dL — AB (ref 0.3–1.2)
Bilirubin, Direct: 0.6 mg/dL — ABNORMAL HIGH (ref 0.1–0.5)
Indirect Bilirubin: 0.8 mg/dL (ref 0.3–0.9)
Total Protein: 6.5 g/dL (ref 6.5–8.1)

## 2015-08-18 LAB — LACTATE DEHYDROGENASE: LDH: 321 U/L — ABNORMAL HIGH (ref 98–192)

## 2015-08-18 LAB — SEDIMENTATION RATE: Sed Rate: 6 mm/hr (ref 0–16)

## 2015-08-18 LAB — PROCALCITONIN: Procalcitonin: 0.58 ng/mL

## 2015-08-18 LAB — LACTIC ACID, PLASMA: LACTIC ACID, VENOUS: 2.6 mmol/L — AB (ref 0.5–2.0)

## 2015-08-18 LAB — ACETAMINOPHEN LEVEL: Acetaminophen (Tylenol), Serum: <10 ug/mL — ABNORMAL LOW (ref 10–30)

## 2015-08-18 MED ORDER — SODIUM CHLORIDE 0.9 % IV SOLN: INTRAVENOUS | Status: DC

## 2015-08-18 MED ORDER — ACETAMINOPHEN 325 MG PO TABS
650.0000 mg | ORAL_TABLET | Freq: Four times a day (QID) | ORAL | Status: DC | PRN
  Administered 2015-08-20 (×2): 650 mg via ORAL
  Filled 2015-08-18 (×2): qty 2

## 2015-08-18 MED ORDER — PIPERACILLIN-TAZOBACTAM 3.375 G IVPB 30 MIN
3.3750 g | Freq: Once | INTRAVENOUS | Status: AC
  Administered 2015-08-18: 3.375 g via INTRAVENOUS
  Filled 2015-08-18: qty 50

## 2015-08-18 MED ORDER — ACETAMINOPHEN 650 MG RE SUPP
650.0000 mg | Freq: Four times a day (QID) | RECTAL | Status: DC | PRN
Start: 2015-08-18 — End: 2015-08-21

## 2015-08-18 MED ORDER — ONDANSETRON HCL 4 MG PO TABS: 4.0000 mg | ORAL_TABLET | Freq: Four times a day (QID) | ORAL | Status: DC | PRN

## 2015-08-18 MED ORDER — ONDANSETRON HCL 4 MG/2ML IJ SOLN: 4.0000 mg | Freq: Four times a day (QID) | INTRAMUSCULAR | Status: DC | PRN

## 2015-08-18 MED ORDER — PIPERACILLIN-TAZOBACTAM 3.375 G IVPB
3.3750 g | Freq: Three times a day (TID) | INTRAVENOUS | Status: DC
  Administered 2015-08-19 – 2015-08-20 (×4): 3.375 g via INTRAVENOUS
  Filled 2015-08-18 (×5): qty 50

## 2015-08-18 MED ORDER — VANCOMYCIN HCL IN DEXTROSE 1-5 GM/200ML-% IV SOLN: 1000.0000 mg | Freq: Once | INTRAVENOUS | Status: DC

## 2015-08-18 MED ORDER — INSULIN ASPART 100 UNIT/ML ~~LOC~~ SOLN
0.0000 [IU] | Freq: Three times a day (TID) | SUBCUTANEOUS | Status: DC
  Administered 2015-08-19: 1 [IU] via SUBCUTANEOUS
  Administered 2015-08-19: 3 [IU] via SUBCUTANEOUS
  Administered 2015-08-20 (×2): 2 [IU] via SUBCUTANEOUS
  Administered 2015-08-20: 3 [IU] via SUBCUTANEOUS
  Administered 2015-08-21: 1 [IU] via SUBCUTANEOUS

## 2015-08-18 MED ORDER — DEXTROSE 5 % IV SOLN
100.0000 mg | Freq: Two times a day (BID) | INTRAVENOUS | Status: DC
  Administered 2015-08-18 – 2015-08-20 (×4): 100 mg via INTRAVENOUS
  Filled 2015-08-18 (×5): qty 100

## 2015-08-18 MED ORDER — VANCOMYCIN HCL IN DEXTROSE 750-5 MG/150ML-% IV SOLN
750.0000 mg | Freq: Two times a day (BID) | INTRAVENOUS | Status: DC
  Administered 2015-08-19 – 2015-08-20 (×3): 750 mg via INTRAVENOUS
  Filled 2015-08-18 (×3): qty 150

## 2015-08-18 MED ORDER — VANCOMYCIN HCL IN DEXTROSE 1-5 GM/200ML-% IV SOLN: 1000.0000 mg | Freq: Two times a day (BID) | INTRAVENOUS | Status: DC

## 2015-08-18 MED ORDER — VANCOMYCIN HCL 10 G IV SOLR
2000.0000 mg | INTRAVENOUS | Status: AC
  Administered 2015-08-18: 2000 mg via INTRAVENOUS
  Filled 2015-08-18: qty 2000

## 2015-08-18 MED ORDER — SODIUM CHLORIDE 0.9 % IV SOLN
INTRAVENOUS | Status: AC
  Administered 2015-08-18: 23:00:00 via INTRAVENOUS

## 2015-08-18 NOTE — Telephone Encounter (Signed)
  PENDING ACCEPTANCE TRANFER NOTE:  Call received from:    Dr. Clelia CroftShaw  REASON FOR REQUESTING TRANSFER:    Fever of 103.588   HPI: 50 years old with asthma and history of uncontrolled diabetes mellitus, has 5-6 days history of nausea/vomiting/diarrhea. Initially was evaluated before 20 11/06/2015, thought to have gastroenteritis and was sent home came back today with 9 pound weight loss, fever of 103.8 last night, he was tachycardic in the office. Sent for further evaluation for nausea vomiting diarrhea, remarkable abnormal labs including evidence of transaminitis, hyponatremia, thrombocytopenia and acute renal failure. Of note he had tick bite on 08/13/2015, Dr. Clelia CroftShaw not sure that is related to his symptoms are not..  Labs includes CBC: Hemoglobin 9.8, platelets 86 BMP: Sodium 129, creatinine 1.3. LFTs: AST is 135 and ALT 191    PLAN:  According to telephone report, [facility]  [facility],  I have requested an order be written to call Flow Manager at [phone] upon patient arrival to the floor for final physician assignment who will do the admission and give admitting orders.  SIGNED: Clint LippsELMAHI,Kemani Demarais A, MD [facility]  08/18/2015, 5:52 PM

## 2015-08-18 NOTE — H&P (Signed)
History and Physical    Jeffrey Welch [id number] DOB: June 30, 1965 DOA: 08/18/2015  Referring MD/NP/PA: Patient is a direct admit from home. PCP: Martha ClanShaw, William, MD  Outpatient Specialists: None. Patient coming from: Home.  Chief Complaint: Fever and chills.  HPI: Jeffrey Welch is a 50 y.o. male with medical history significant of history of diabetes mellitus type 2 has been experiencing fever and chills with nausea vomiting and diarrhea over the last 5 days. Patient had originally gone to the primary care's office on the first day of the symptoms and was treated symptomatically. Patient had to go to the primary care again today because of the persistent fever and chills and patient has been taking Tylenol and ibuprofen. In the primary care office labs revealed thrombocytopenia with platelets around 86 and AST/ALT 135/191. As per the patient he has been running temperatures around 103F. Patient 5 days ago has noticed a tick on his abdomen. He has been having joint pains and on my exam patient also has a circular rash on his abdomen. I have repeated patient's labs and lactic and mildly elevated with chest x-ray showing no infiltrates. Patient is mildly febrile. I'll start patient on IV fluids and empiric antibiotics after blood cultures were obtained.    ED Course: Patient was a direct admit from home.  Review of Systems: As per HPI otherwise 10 point review of systems negative.    Past Medical History  Diagnosis Date  . Diabetes mellitus without complication (HCC)     diet controlled  . Hyperlipidemia   . Obesity   . Sciatica of right side     Past Surgical History  Procedure Laterality Date  . Vasectomy    . Appendectomy    . Anterior cervical surgery       reports that he has been smoking Cigarettes.  He has a 6.25 pack-year smoking history. He does not have any smokeless tobacco history on file. He reports that he drinks alcohol. He reports that he does not use illicit  drugs.  No Known Allergies  Family History  Problem Relation Age of Onset  . Diabetes Mother   . Hypertension Mother     Prior to Admission medications   Medication Sig Start Date End Date Taking? Authorizing Provider  acetaminophen (TYLENOL) 500 MG tablet Take 1,000 mg by mouth every 4 (four) hours as needed for fever.   Yes Historical Provider, MD  ibuprofen (ADVIL,MOTRIN) 200 MG tablet Take 400 mg by mouth every 4 (four) hours as needed for moderate pain. pain   Yes Historical Provider, MD  Multiple Vitamins-Minerals (MULTIVITAMIN & MINERAL PO) Take 1 tablet by mouth daily.   Yes Historical Provider, MD  metFORMIN (GLUCOPHAGE) 1000 MG tablet Take 500 mg by mouth 2 (two) times daily with a meal.     Historical Provider, MD  traMADol (ULTRAM) 50 MG tablet Take 1 tablet (50 mg total) by mouth every 6 (six) hours as needed for pain. Patient not taking: Reported on 08/18/2015 03/31/12   Elpidio AnisShari Upstill, PA-C    Physical Exam: Filed Vitals:   08/18/15 1846 08/18/15 1850  BP: 137/94 122/89  Pulse: 125 142  Temp: 99.4 F (37.4 C)   TempSrc: Oral   Resp: 20   Height: 5\' 8"  (1.727 m)   Weight: 228 lb 9.9 oz (103.7 kg)   SpO2: 99%       Constitutional: NAD, calm, comfortable Filed Vitals:   08/18/15 1846 08/18/15 1850  BP: 137/94 122/89  Pulse: 125 142  Temp: 99.4 F (37.4 C)   TempSrc: Oral   Resp: 20   Height:  (1.727 m)   Weight: 228 lb 9.9 oz (103.7 kg)   SpO2: 99%    Eyes: PERRL, lids and conjunctivae normal ENMT: Mucous membranes are moist. Posterior pharynx clear of any exudate or lesions.Normal dentition.  Neck: normal, supple, no masses, no thyromegaly Respiratory: clear to auscultation bilaterally, no wheezing, no crackles. Normal respiratory effort. No accessory muscle use.  Cardiovascular: Regular rate and rhythm, no murmurs / rubs / gallops. No extremity edema. 2+ pedal pulses. No carotid bruits.  Abdomen: no tenderness, no masses palpated. No  hepatosplenomegaly. Bowel sounds positive.  Musculoskeletal: no clubbing / cyanosis. No joint deformity upper and lower extremities. Good ROM, no contractures. Normal muscle tone.  Skin: Patient has a circular rash on the abdomen. Neurologic: CN 2-12 grossly intact. Sensation intact, DTR normal. Strength 5/5 in all 4.  Psychiatric: Normal judgment and insight. Alert and oriented x 3. Normal mood.    Labs on Admission: I have personally reviewed following labs and imaging studies  CBC:  Recent Labs Lab 08/18/15 2044  WBC 8.0  NEUTROABS 5.3  HGB 16.1  HCT 43.8  MCV 84.6  PLT PENDING   Basic Metabolic Panel:  Recent Labs Lab 08/18/15 2044  NA 129*  K 3.7  CL 91*  CO2 27  GLUCOSE 216*  BUN 14  CREATININE 1.02  CALCIUM 8.8*   GFR: Estimated Creatinine Clearance: 101.1 mL/min (by C-G formula based on Cr of 1.02). Liver Function Tests: No results for input(s): AST, ALT, ALKPHOS, BILITOT, PROT, ALBUMIN in the last 168 hours. No results for input(s): LIPASE, AMYLASE in the last 168 hours. No results for input(s): AMMONIA in the last 168 hours. Coagulation Profile: No results for input(s): INR, PROTIME in the last 168 hours. Cardiac Enzymes: No results for input(s): CKTOTAL, CKMB, CKMBINDEX, TROPONINI in the last 168 hours. BNP (last 3 results) No results for input(s): PROBNP in the last 8760 hours. HbA1C: No results for input(s): HGBA1C in the last 72 hours. CBG: No results for input(s): GLUCAP in the last 168 hours. Lipid Profile: No results for input(s): CHOL, HDL, LDLCALC, TRIG, CHOLHDL, LDLDIRECT in the last 72 hours. Thyroid Function Tests: No results for input(s): TSH, T4TOTAL, FREET4, T3FREE, THYROIDAB in the last 72 hours. Anemia Panel: No results for input(s): VITAMINB12, FOLATE, FERRITIN, TIBC, IRON, RETICCTPCT in the last 72 hours. Urine analysis:    Component Value Date/Time   COLORURINE YELLOW 04/30/2011 0925   APPEARANCEUR CLOUDY* 04/30/2011 0925    LABSPEC 1.023 04/30/2011 0925   PHURINE 5.0 04/30/2011 0925   GLUCOSEU NEGATIVE 04/30/2011 0925   HGBUR LARGE* 04/30/2011 0925   BILIRUBINUR NEGATIVE 04/30/2011 0925   KETONESUR TRACE* 04/30/2011 0925   PROTEINUR NEGATIVE 04/30/2011 0925   UROBILINOGEN 0.2 04/30/2011 0925   NITRITE NEGATIVE 04/30/2011 0925   LEUKOCYTESUR NEGATIVE 04/30/2011 0925   Sepsis Labs: (procalcitonin:4,lacticidven:4) )No results found for this or any previous visit (from the past 240 hour(s)).   Radiological Exams on Admission: Dg Chest Port 1 View  08/18/2015  CLINICAL DATA:  Fever and body aches for 4 days. EXAM: PORTABLE CHEST 1 VIEW COMPARISON:  03/31/2012 FINDINGS: Examination is degraded due to patient body habitus and portable technique. Grossly unchanged borderline enlarged cardiac silhouette and mediastinal contours given persistently reduced lung volumes. No focal airspace opacities. No pleural effusion or pneumothorax. No evidence of edema. No acute osseus abnormalities. Post lower cervical ACDF, incompletely evaluated. IMPRESSION: No  definite acute cardiopulmonary disease on this hypoventilated AP portable examination. Further evaluation with a PA and lateral chest radiograph may be obtained as clinically indicated. Electronically Signed   By: Simonne Come M.D.   On: 08/18/2015 21:01    EKG: Independently reviewed. Sinus tachycardia.  Assessment/Plan Principal Problem:   SIRS (systemic inflammatory response syndrome) (HCC) Active Problems:   Type 2 diabetes mellitus with hyperglycemia (HCC)   Thrombocytopenia (HCC)   Elevated LFTs    #1. SIRS/developing sepsis - source not clear. At this time I have ordered blood cultures urine cultures UA and since patient also has tick exposure and borderline titers and [facility] spotted fever titers. For now I have placed patient in pretty on vancomycin and Zosyn and doxycycline. Continue with aggressive hydration and follow lactic acid levels and  procalcitonin levels. Check stool studies including GI pathogen panel and C. difficile. #2. Thrombocytopenia - probably from infectious etiology. Closely follow CBC. LDH is mildly elevated but creatinine is normal so patient probably does not have TTP/HUS but will closely observe and I have also ordered a peripheral smear study. #3. Elevated LFTs - check acute hepatitis panel and sonogram of the abdomen. Follow LFTs closely. Tylenol levels are undetectable. Follow INR. #4. Diabetes mellitus type 2 with hyperglycemia - for now we will hydrate and keep patient on sliding scale coverage. Based on CBGs we will see if patient needs long-acting insulin while inpatient. Hold metformin while inpatient. #5. Sinus tachycardia probably from #1. Will check TSH.   DVT prophylaxis: SCDs due to thrombocytopenia. Code Status: Full code.  Family Communication: Patient's wife at the bedside.  Disposition Plan: Home.  Consults called: None.  Admission status: Inpatient. Likely stay 3-4 days.    Eduard Clos MD [facility] Pager [phone].  If 7PM-7AM, please contact night-coverage [email or web address] Password [facility]  08/18/2015, 9:41 PM

## 2015-08-18 NOTE — Progress Notes (Addendum)
Pharmacy Antibiotic Note  Elvera Marianibal Maish is a 50 y.o. male admitted on 08/18/2015 with sepsis.  Pharmacy has been consulted for Vancomycin and Zosyn dosing.  Pt also had tick bite on 4/26 therefore MD adding doxcycline. Noted ARF.   Plan: Vancomycin 2000mg  IV x1 now, then 750mg  IV q12h Zosyn 3.375g IV q8h (infuse over 4 hours) Doxycycline 100mg  IV q12h per MD F/u renal fxn, cultures, VT at Css as warranted, clinical course  Height: 5\' 8"  (172.7 cm) Weight: 228 lb 9.9 oz (103.7 kg) IBW/kg (Calculated) : 68.4  Temp (24hrs), Avg:99.4 F (37.4 C), Min:99.4 F (37.4 C), Max:99.4 F (37.4 C)   Recent Labs Lab 08/18/15 2044  WBC 8.0  CREATININE 1.02  LATICACIDVEN 2.6*    Estimated Creatinine Clearance: 101.1 mL/min (by C-G formula based on Cr of 1.02).    No Known Allergies  Antimicrobials this admission: 5/1 Vancomycin >>  5/1 Zosyn >>  5/1 Doxycycline >>  Dose adjustments this admission:   Microbiology results: 5/1 BCx: IP 5/1 CDiff PCR: ordered 5/1 GI panel PCR: ordered  Thank you for allowing pharmacy to be a part of this patient's care.  Haynes Hoehnolleen Darnell Jeschke, PharmD, BCPS 08/18/2015, 9:47 PM  Pager: [phone]

## 2015-08-19 ENCOUNTER — Inpatient Hospital Stay (HOSPITAL_COMMUNITY): Payer: 59

## 2015-08-19 DIAGNOSIS — A419 Sepsis, unspecified organism: Secondary | ICD-10-CM

## 2015-08-19 DIAGNOSIS — D696 Thrombocytopenia, unspecified: Secondary | ICD-10-CM

## 2015-08-19 DIAGNOSIS — E1165 Type 2 diabetes mellitus with hyperglycemia: Secondary | ICD-10-CM

## 2015-08-19 DIAGNOSIS — R7989 Other specified abnormal findings of blood chemistry: Secondary | ICD-10-CM

## 2015-08-19 LAB — COMPREHENSIVE METABOLIC PANEL
ALK PHOS: 69 U/L (ref 38–126)
ALT: 116 U/L — AB (ref 17–63)
ANION GAP: 7 (ref 5–15)
AST: 77 U/L — ABNORMAL HIGH (ref 15–41)
Albumin: 2.9 g/dL — ABNORMAL LOW (ref 3.5–5.0)
BILIRUBIN TOTAL: 1.3 mg/dL — AB (ref 0.3–1.2)
BUN: 10 mg/dL (ref 6–20)
CALCIUM: 7.9 mg/dL — AB (ref 8.9–10.3)
CO2: 27 mmol/L (ref 22–32)
CREATININE: 0.88 mg/dL (ref 0.61–1.24)
Chloride: 100 mmol/L — ABNORMAL LOW (ref 101–111)
GFR calc non Af Amer: >60 mL/min (ref >60)
Glucose, Bld: 167 mg/dL — ABNORMAL HIGH (ref 65–99)
Potassium: 3.5 mmol/L (ref 3.5–5.1)
SODIUM: 134 mmol/L — AB (ref 135–145)
TOTAL PROTEIN: 5.6 g/dL — AB (ref 6.5–8.1)

## 2015-08-19 LAB — GASTROINTESTINAL PANEL BY PCR, STOOL (REPLACES STOOL CULTURE)
Adenovirus F40/41: NOT DETECTED
Astrovirus: NOT DETECTED
CYCLOSPORA CAYETANENSIS: NOT DETECTED
Campylobacter species: DETECTED — AB
Cryptosporidium: NOT DETECTED
E. COLI O157: NOT DETECTED
ENTAMOEBA HISTOLYTICA: NOT DETECTED
ENTEROTOXIGENIC E COLI (ETEC): NOT DETECTED
Enteroaggregative E coli (EAEC): NOT DETECTED
Enteropathogenic E coli (EPEC): NOT DETECTED
Giardia lamblia: NOT DETECTED
NOROVIRUS GI/GII: NOT DETECTED
Plesimonas shigelloides: NOT DETECTED
ROTAVIRUS A: NOT DETECTED
SALMONELLA SPECIES: NOT DETECTED
SAPOVIRUS (I, II, IV, AND V): NOT DETECTED
SHIGA LIKE TOXIN PRODUCING E COLI (STEC): NOT DETECTED
SHIGELLA/ENTEROINVASIVE E COLI (EIEC): NOT DETECTED
VIBRIO CHOLERAE: NOT DETECTED
VIBRIO SPECIES: NOT DETECTED
Yersinia enterocolitica: NOT DETECTED

## 2015-08-19 LAB — PROTIME-INR
INR: 1.17 (ref 0.00–1.49)
Prothrombin Time: 15.1 seconds (ref 11.6–15.2)

## 2015-08-19 LAB — CBC WITH DIFFERENTIAL/PLATELET
BASOS PCT: 2 %
Basophils Absolute: 0.2 10*3/uL — ABNORMAL HIGH (ref 0.0–0.1)
EOS ABS: 0.1 10*3/uL (ref 0.0–0.7)
Eosinophils Relative: 1 %
HCT: 37.6 % — ABNORMAL LOW (ref 39.0–52.0)
HEMOGLOBIN: 13.5 g/dL (ref 13.0–17.0)
LYMPHS ABS: 2.5 10*3/uL (ref 0.7–4.0)
LYMPHS PCT: 30 %
MCH: 30.7 pg (ref 26.0–34.0)
MCHC: 35.9 g/dL (ref 30.0–36.0)
MCV: 85.5 fL (ref 78.0–100.0)
MONO ABS: 1.1 10*3/uL — AB (ref 0.1–1.0)
Monocytes Relative: 13 %
NEUTROS ABS: 4.3 10*3/uL (ref 1.7–7.7)
Neutrophils Relative %: 54 %
Platelets: 72 10*3/uL — ABNORMAL LOW (ref 150–400)
RBC: 4.4 MIL/uL (ref 4.22–5.81)
RDW: 13.4 % (ref 11.5–15.5)
WBC: 8.2 10*3/uL (ref 4.0–10.5)

## 2015-08-19 LAB — GLUCOSE, CAPILLARY
GLUCOSE-CAPILLARY: 207 mg/dL — AB (ref 65–99)
GLUCOSE-CAPILLARY: 144 mg/dL — AB (ref 65–99)
GLUCOSE-CAPILLARY: 152 mg/dL — AB (ref 65–99)
Glucose-Capillary: 151 mg/dL — ABNORMAL HIGH (ref 65–99)

## 2015-08-19 LAB — INFLUENZA PANEL BY PCR (TYPE A & B)
H1N1FLUPCR: NOT DETECTED
INFLBPCR: NEGATIVE
Influenza A By PCR: NEGATIVE

## 2015-08-19 LAB — TYPE AND SCREEN
ABO/RH(D): A NEG
Antibody Screen: NEGATIVE

## 2015-08-19 LAB — ABO/RH: ABO/RH(D): A NEG

## 2015-08-19 LAB — CLOSTRIDIUM DIFFICILE BY PCR: CDIFFPCR: NEGATIVE

## 2015-08-19 LAB — LACTIC ACID, PLASMA: Lactic Acid, Venous: 1.6 mmol/L (ref 0.5–2.0)

## 2015-08-19 LAB — C DIFFICILE QUICK SCREEN W PCR REFLEX

## 2015-08-19 LAB — PATHOLOGIST SMEAR REVIEW

## 2015-08-19 NOTE — Care Management Note (Signed)
Case Management Note  Patient Details  Name: Jeffrey Welch MRN: [id number] Date of Birth: December 07, 1965  Subjective/Objective:   50 y/o m admitted w/SIRS. From home.                 Action/Plan:d/c plan home.   Expected Discharge Date:                  Expected Discharge Plan:  Home/Self Care  In-House Referral:     Discharge planning Services  CM Consult  Post Acute Care Choice:    Choice offered to:     DME Arranged:    DME Agency:     HH Arranged:    HH Agency:     Status of Service:  In process, will continue to follow  Medicare Important Message Given:    Date Medicare IM Given:    Medicare IM give by:    Date Additional Medicare IM Given:    Additional Medicare Important Message give by:     If discussed at Long Length of Stay Meetings, dates discussed:    Additional Comments:  Lanier ClamMahabir, Adal Sereno, RN 08/19/2015, 2:05 PM

## 2015-08-19 NOTE — Progress Notes (Signed)
Initial Nutrition Assessment  DOCUMENTATION CODES:   Not applicable  INTERVENTION:  -RD continue to monitor  NUTRITION DIAGNOSIS:   Inadequate oral intake related to poor appetite as evidenced by per patient/family report.  GOAL:   Patient will meet greater than or equal to 90% of their needs  MONITOR:   PO intake, Diet advancement, Labs, I & O's, Weight trends  REASON FOR ASSESSMENT:   Malnutrition Screening Tool    ASSESSMENT:   Jeffrey Welch is a 50 y.o. male with medical history significant of history of diabetes mellitus type 2 has been experiencing fever and chills with nausea vomiting and diarrhea over the last 5 days. Patient had originally gone to the primary care's office on the first day of the symptoms and was treated symptomatically. Patient had to go to the primary care again today because of the persistent fever and chills and patient has been taking Tylenol and ibuprofen.  Spoke with Jeffrey Welch and his wife at bedside. He endorses not eating anything since last Wednesday evening.   Prior to that, no issues with appetite, chewing/swallowing, or nausea/vomiting. He endorses weight loss of about 10# in 1 week.  Currently he is on a full liquid diet, no complaints, he states that he tried something similar at home along with fruits and there were no issues. He states that when he starts consuming pork and rice and beans and other meats, he has some nausea and feels uneasy.  He is currently Septic, will continue to follow for diet advancement and tolerance.  Labs and medications reviewed.  Diet Order:  Diet full liquid Room service appropriate?: Yes; Fluid consistency:: Thin  Skin:  Reviewed, no issues  Last BM:  08/19/2015  Height:   Ht Readings from Last 1 Encounters:  08/18/15 5\' 8"  (1.727 m)    Weight:   Wt Readings from Last 1 Encounters:  08/19/15 237 lb 10.5 oz (107.8 kg)    Ideal Body Weight:  70 kg  BMI:  Body mass index is 36.14  kg/(m^2).  Estimated Nutritional Needs:   Kcal:  2000-2400 calories  Protein:  70-85 grams  Fluid:  >/= 2L  EDUCATION NEEDS:   No education needs identified at this time  Dionne AnoWilliam M. Mahala Rommel, MS, RD LDN After Hours/Weekend Pager [phone]

## 2015-08-19 NOTE — Progress Notes (Signed)
PROGRESS NOTE  Jeffrey Welch VWU:[id number] DOB: [birth year] DOA: 08/18/2015 PCP: Martha Clan, MD Outpatient Specialists:  [facility], Neurology  Brief History:  50 year old male with history diabetes mellitus and hyperlipidemia presented with one-week history of nausea, vomiting, diarrhea, and fevers. The patient went to see his primary care provider on 08/14/2015. He was given conservative therapy. His symptoms persisted. He saw his primary care provider again on 08/18/2015. He was noted to have elevated LFTs with fevers up to 103.53F. As result, direct admission was requested. The patient states that he pulled a tick from his abdominal wall on the evening of 08/14/2015. The patient has lived in [place] for approximately 12 years, but was in the [employer] with numerous deployments throughout the world. Prior to moving to [place], the patient lived in [place]. The patient has also been complaining of arthralgias, myalgias, with sore throat and nonproductive cough. There was no hematemesis or hematochezia. He complained of intermittent headaches without visual disturbance or focal weakness.  Assessment/Plan: Sepsis -Patient presented with fever, tachycardia, and lactic acid 2.6 -Suspicion for tickborne illness -RMSF and Lyme serologies sent 5/1 -check Ehrlichia serologies -pt did have an erythematous rash on abd wall with central clearing but did not appear like classical erythema migrans -continue doxycycline -continue empiric vanco and zosyn pending culture data -continue IVF -lactate = 2.6 -Urinalysis negative for pyuria -Chest x-ray negative -Follow blood cultures -Urine drug screen positive for THC  Fever/Transaminasemia/Thrombocytopenia -HIV -Hep B surface antigen--pending -Hep C antibody--pending -check Ehrlichia serologies -Abd [place] -check monospot -check EBV DNA and CMV DNA by PCR -check flu  Diarrhea -C. difficile pending -Stool pathogen  panel  Diabetes mellitus type 2 -Hemoglobin A1c -NovoLog sliding scale -Hold metformin  Hyponatremia -Secondary to volume depletion -Improving with IV fluids    Disposition Plan:   Home in 2-3 days  Family Communication: No  Family at beside  Consultants:  None  Code Status: FULL    Antibiotics:  vanco 5/1>>>  Zosyn 5/1>>>  Doxy 5/1>>>  Subjective: Patient complains of arthralgias. He denies any chest pain, short of breath, abdominal pain. Continues to have vomiting and diarrhea. No medication on a. No dysuria or hematuria. No rashes.   Objective: Filed Vitals:   08/18/15 1850 08/18/15 2155 08/19/15 0500 08/19/15 0630  BP: 122/89 134/86  113/70  Pulse: 142 113  100  Temp:  99.5 F (37.5 C)  99.1 F (37.3 C)  TempSrc:  Oral  Oral  Resp:  19  20  Height:      Weight:   107.8 kg (237 lb 10.5 oz)   SpO2:  100%  98%    Intake/Output Summary (Last 24 hours) at 08/19/15 0805 Last data filed at 08/19/15 2956  Gross per 24 hour  Intake   3205 ml  Output   1950 ml  Net   1255 ml   Weight change:  Exam:   General:  Pt is alert, follows commands appropriately, not in acute distress  HEENT: No icterus, No thrush, No neck mass, /AT  Cardiovascular: RRR, S1/S2, no rubs, no gallops  Respiratory: CTA bilaterally, no wheezing, no crackles, no rhonchi  Abdomen: Soft/+BS, non tender, non distended, no guarding; circular rash on the right side of the umbilicus with central clearing.   Extremities: No edema, No lymphangitis, No petechiae, No rashes, no synovitis   Data Reviewed: I have personally reviewed following labs and imaging studies Basic Metabolic Panel:  Recent Labs  Lab 08/18/15 2044 08/19/15 0428  NA 129* 134*  K 3.7 3.5  CL 91* 100*  CO2 27 27  GLUCOSE 216* 167*  BUN 14 10  CREATININE 1.02 0.88  CALCIUM 8.8* 7.9*   Liver Function Tests:  Recent Labs Lab 08/18/15 2044 08/19/15 0428  AST 115* 77*  ALT 161* 116*  ALKPHOS 83 69    BILITOT 1.4* 1.3*  PROT 6.5 5.6*  ALBUMIN 3.4* 2.9*   No results for input(s): LIPASE, AMYLASE in the last 168 hours. No results for input(s): AMMONIA in the last 168 hours. Coagulation Profile:  Recent Labs Lab 08/19/15 0428  INR 1.17   CBC:  Recent Labs Lab 08/18/15 2044 08/19/15 0428  WBC 8.0 8.2  NEUTROABS 5.3 4.3  HGB 16.1 13.5  HCT 43.8 37.6*  MCV 84.6 85.5  PLT 60* 72*   Cardiac Enzymes: No results for input(s): CKTOTAL, CKMB, CKMBINDEX, TROPONINI in the last 168 hours. BNP: Invalid input(s): POCBNP CBG: No results for input(s): GLUCAP in the last 168 hours. HbA1C: No results for input(s): HGBA1C in the last 72 hours. Urine analysis:    Component Value Date/Time   COLORURINE AMBER* 08/18/2015 2200   APPEARANCEUR CLEAR 08/18/2015 2200   LABSPEC 1.025 08/18/2015 2200   PHURINE 6.0 08/18/2015 2200   GLUCOSEU NEGATIVE 08/18/2015 2200   HGBUR NEGATIVE 08/18/2015 2200   BILIRUBINUR SMALL* 08/18/2015 2200   KETONESUR NEGATIVE 08/18/2015 2200   PROTEINUR 30* 08/18/2015 2200   UROBILINOGEN 0.2 04/30/2011 0925   NITRITE NEGATIVE 08/18/2015 2200   LEUKOCYTESUR NEGATIVE 08/18/2015 2200   Sepsis Labs: @LABRCNTIP (procalcitonin:4,lacticidven:4) ) Recent Results (from the past 240 hour(s))  Culture, blood (routine x 2)     Status: None (Preliminary result)   Collection Time: 08/18/15  8:44 PM  Result Value Ref Range Status   Specimen Description BLOOD LEFT ANTECUBITAL  Final   Special Requests BOTTLES DRAWN AEROBIC ONLY 5CC  Final   Culture PENDING  Incomplete   Report Status PENDING  Incomplete  Culture, blood (routine x 2)     Status: None (Preliminary result)   Collection Time: 08/18/15  8:45 PM  Result Value Ref Range Status   Specimen Description BLOOD RIGHT ANTECUBITAL  Final   Special Requests BOTTLES DRAWN AEROBIC ONLY 5CC  Final   Culture PENDING  Incomplete   Report Status PENDING  Incomplete  C difficile quick scan w PCR reflex     Status:  Abnormal   Collection Time: 08/18/15 10:10 PM  Result Value Ref Range Status   C Diff antigen RESULTS UNAVAILABLE DUE TO INTERFERING SUBSTANCE (A) NEGATIVE Final   C Diff toxin RESULTS UNAVAILABLE DUE TO INTERFERING SUBSTANCE (A) NEGATIVE Final   C Diff interpretation Results are indeterminate. See PCR results.  Final     Scheduled Meds: . doxycycline (VIBRAMYCIN) IV  100 mg Intravenous Q12H  . insulin aspart  0-9 Units Subcutaneous TID WC  . piperacillin-tazobactam (ZOSYN)  IV  3.375 g Intravenous Q8H  . vancomycin  750 mg Intravenous Q12H   Continuous Infusions: . sodium chloride 150 mL/hr at 08/18/15 2258    Procedures/Studies: Dg Chest Port 1 View  08/18/2015  CLINICAL DATA:  Fever and body aches for 4 days. EXAM: PORTABLE CHEST 1 VIEW COMPARISON:  03/31/2012 FINDINGS: Examination is degraded due to patient body habitus and portable technique. Grossly unchanged borderline enlarged cardiac silhouette and mediastinal contours given persistently reduced lung volumes. No focal airspace opacities. No pleural effusion or pneumothorax. No evidence of edema. No acute osseus  abnormalities. Post lower cervical ACDF, incompletely evaluated. IMPRESSION: No definite acute cardiopulmonary disease on this hypoventilated AP portable examination. Further evaluation with a PA and lateral chest radiograph may be obtained as clinically indicated. Electronically Signed   By: Simonne Come M.D.   On: 08/18/2015 21:01    Zion Lint, DO  [facility] Pager [phone]  If 7PM-7AM, please contact night-coverage [email or web address] Password [email or web address] 08/19/2015, 8:05 AM   LOS: 1 day

## 2015-08-20 LAB — CBC WITH DIFFERENTIAL/PLATELET
Basophils Absolute: 0.2 10*3/uL — ABNORMAL HIGH (ref 0.0–0.1)
Basophils Relative: 3 %
EOS PCT: 1 %
Eosinophils Absolute: 0.1 10*3/uL (ref 0.0–0.7)
HEMATOCRIT: 37.4 % — AB (ref 39.0–52.0)
Hemoglobin: 13 g/dL (ref 13.0–17.0)
LYMPHS ABS: 4.5 10*3/uL — AB (ref 0.7–4.0)
Lymphocytes Relative: 57 %
MCH: 30.4 pg (ref 26.0–34.0)
MCHC: 34.8 g/dL (ref 30.0–36.0)
MCV: 87.4 fL (ref 78.0–100.0)
MONO ABS: 0.8 10*3/uL (ref 0.1–1.0)
MONOS PCT: 10 %
NEUTROS ABS: 2.3 10*3/uL (ref 1.7–7.7)
Neutrophils Relative %: 29 %
Platelets: 117 10*3/uL — ABNORMAL LOW (ref 150–400)
RBC: 4.28 MIL/uL (ref 4.22–5.81)
RDW: 13.8 % (ref 11.5–15.5)
WBC: 7.9 10*3/uL (ref 4.0–10.5)

## 2015-08-20 LAB — COMPREHENSIVE METABOLIC PANEL
ALBUMIN: 2.8 g/dL — AB (ref 3.5–5.0)
ALT: 91 U/L — AB (ref 17–63)
AST: 64 U/L — AB (ref 15–41)
Alkaline Phosphatase: 71 U/L (ref 38–126)
Anion gap: 7 (ref 5–15)
BILIRUBIN TOTAL: 1 mg/dL (ref 0.3–1.2)
BUN: 7 mg/dL (ref 6–20)
CHLORIDE: 104 mmol/L (ref 101–111)
CO2: 29 mmol/L (ref 22–32)
CREATININE: 0.86 mg/dL (ref 0.61–1.24)
Calcium: 8.5 mg/dL — ABNORMAL LOW (ref 8.9–10.3)
GFR calc Af Amer: >60 mL/min (ref >60)
GFR calc non Af Amer: >60 mL/min (ref >60)
GLUCOSE: 171 mg/dL — AB (ref 65–99)
POTASSIUM: 3.2 mmol/L — AB (ref 3.5–5.1)
Sodium: 140 mmol/L (ref 135–145)
TOTAL PROTEIN: 5.5 g/dL — AB (ref 6.5–8.1)

## 2015-08-20 LAB — EHRLICHIA ANTIBODY PANEL
E CHAFFEENSIS AB, IGG: NEGATIVE
E CHAFFEENSIS AB, IGM: NEGATIVE
E. Chaffeensis (HME) IgM Titer: NEGATIVE
E.Chaffeensis (HME) IgG: NEGATIVE

## 2015-08-20 LAB — ROCKY MTN SPOTTED FVR ABS PNL(IGG+IGM)
RMSF IGM: 0.23 {index} (ref 0.00–0.89)
RMSF IgG: NEGATIVE

## 2015-08-20 LAB — GLUCOSE, CAPILLARY
GLUCOSE-CAPILLARY: 161 mg/dL — AB (ref 65–99)
Glucose-Capillary: 169 mg/dL — ABNORMAL HIGH (ref 65–99)
Glucose-Capillary: 202 mg/dL — ABNORMAL HIGH (ref 65–99)
Glucose-Capillary: 182 mg/dL — ABNORMAL HIGH (ref 65–99)

## 2015-08-20 LAB — HEPATITIS PANEL, ACUTE
HEP A IGM: NEGATIVE
Hep B C IgM: NEGATIVE
Hepatitis B Surface Ag: NEGATIVE

## 2015-08-20 LAB — B. BURGDORFI ANTIBODIES: B burgdorferi Ab IgG+IgM: <0.91 {ISR} (ref 0.00–0.90)

## 2015-08-20 LAB — HIV ANTIBODY (ROUTINE TESTING W REFLEX): HIV SCREEN 4TH GENERATION: NONREACTIVE

## 2015-08-20 LAB — PROCALCITONIN: Procalcitonin: 0.24 ng/mL

## 2015-08-20 MED ORDER — POTASSIUM CHLORIDE CRYS ER 20 MEQ PO TBCR
40.0000 meq | EXTENDED_RELEASE_TABLET | Freq: Once | ORAL | Status: AC
  Administered 2015-08-20: 40 meq via ORAL
  Filled 2015-08-20: qty 2

## 2015-08-20 MED ORDER — DIPHENHYDRAMINE HCL 50 MG PO CAPS
50.0000 mg | ORAL_CAPSULE | Freq: Once | ORAL | Status: AC
  Administered 2015-08-20: 50 mg via ORAL
  Filled 2015-08-20: qty 1

## 2015-08-20 MED ORDER — DIPHENHYDRAMINE HCL 50 MG PO CAPS
50.0000 mg | ORAL_CAPSULE | Freq: Every evening | ORAL | Status: DC | PRN
  Administered 2015-08-20: 50 mg via ORAL
  Filled 2015-08-20: qty 1

## 2015-08-20 MED ORDER — CIPROFLOXACIN HCL 500 MG PO TABS
500.0000 mg | ORAL_TABLET | Freq: Two times a day (BID) | ORAL | Status: DC
  Administered 2015-08-20 – 2015-08-21 (×2): 500 mg via ORAL
  Filled 2015-08-20 (×4): qty 1

## 2015-08-20 NOTE — Progress Notes (Addendum)
[facility] PROGRESS NOTE  Jakeb Lamping ZOX:[id number] DOB: 19-Sep-1965 DOA: 08/18/2015 PCP: Martha Clan, MD  Brief Narrative: 50 year old male with history diabetes mellitus and hyperlipidemia presented with one-week history of nausea, vomiting, diarrhea, and fevers. He had associated arthralgias, myalgias.Also gave a hx of pulling a tick from his abdominal wall on the evening of 08/14/2015. UA/chest x-ray/blood cultures continue to be negative, empirically started on broad-spectrum antibiotics-with significant improvement.  See below for further details  Subjective: Feeling much better, no joint pain or fever.   Assessment/Plan: Sepsis: Presented initially with fever, vomiting, diarrhea, arthralgias, myalgias and a circular rash in his anterior abdominal wall at the site of a tick bite. Found to be thrombocytopenic and with elevated LFTs. UA, chest x-ray and blood cultures negative. His stool GI pathogen panel is positive for Campylobacter. At this point not sure if his symptoms can be just graduated to Campylobacter gastroenteritis or this is a tickborne illness. RMSF/Ehrlicia and Lyme serology all pending. At this time, will discontinue vancomycin and Zosyn, and just maintain on doxycycline. I will discuss with infectious disease whether this is appropriate coverage for Campylobacter infection or needs further adjustment.  Addendum Spoke with ID-treat with Cipro for 3 more days to complete 5 days course of antibiotics for campylobactor. Continue Doxycycline for 7 days  Thrombocytopenia: Likely due to above. Platelets have improved from 60 to 117. Continue to monitor.  Elevated LFTs: Likely due to sepsis. AST improved from 115 to 64, ALT improved from 161 to 91. Acute hepatitis serology negative, abdominal ultrasound without any obvious biliary abnormalities. Continue to monitor.   Diabetes Mellitus Type 2: Glucose has been stable. Continue to hold metformin. SSI.   Diarrhea:  Resolved. C.diff negative. Stool panel positive for Campylobacter.See above regarding Abx  Hyponatremia:Resolved. Secondary to volume depletion from lack of oral intake.   Hypokalemia: 3.2 today. Replete and follow.   Code Status: Full Family Communication: None at bedside Disposition Plan: Remain inpatient   Consultants:  None  Procedures:  None  Antibiotics:  Zosyn 5/1 >> 5/3  Vancomycin 5/1 >> 5/3  Doxycycline 5/1 >>    Objective: Filed Vitals:   08/20/15 0551 08/20/15 0758  BP: 123/73 114/71  Pulse: 78 80  Temp: 97.5 F (36.4 C) 97.4 F (36.3 C)  Resp: 20     Intake/Output Summary (Last 24 hours) at 08/20/15 1305 Last data filed at 08/20/15 0900  Gross per 24 hour  Intake   1400 ml  Output   1950 ml  Net   -550 ml   Filed Weights   08/18/15 1846 08/19/15 0500 08/20/15 0551  Weight: 103.7 kg (228 lb 9.9 oz) 107.8 kg (237 lb 10.5 oz) 103 kg (227 lb 1.2 oz)    Exam:   General:  Awake, alert, NAD. Sitting in chair at bedside.   Cardiovascular: RRR no m/g/r  Respiratory: CTAB, no accessory muscle use, no crackles/rhonchi/ wheezing  Abdomen: Soft, nontender, nondistended. Faint ircular rash with central clearing located on the right side of the umbilicus  Musculoskeletal: No tenderness over joints  Extremities: No edema, rashes, color change  Data Reviewed: Basic Metabolic Panel:  Recent Labs Lab 08/18/15 2044 08/19/15 0428 08/20/15 0530  NA 129* 134* 140  K 3.7 3.5 3.2*  CL 91* 100* 104  CO2 27 27 29   GLUCOSE 216* 167* 171*  BUN 14 10 7   CREATININE 1.02 0.88 0.86  CALCIUM 8.8* 7.9* 8.5*   Liver Function Tests:  Recent Labs Lab 08/18/15 2044 08/19/15 0428 08/20/15  0530  AST 115* 77* 64*  ALT 161* 116* 91*  ALKPHOS 83 69 71  BILITOT 1.4* 1.3* 1.0  PROT 6.5 5.6* 5.5*  ALBUMIN 3.4* 2.9* 2.8*   No results for input(s): LIPASE, AMYLASE in the last 168 hours. No results for input(s): AMMONIA in the last 168  hours. CBC:  Recent Labs Lab 08/18/15 2044 08/19/15 0428 08/20/15 0530  WBC 8.0 8.2 7.9  NEUTROABS 5.3 4.3 2.3  HGB 16.1 13.5 13.0  HCT 43.8 37.6* 37.4*  MCV 84.6 85.5 87.4  PLT 60* 72* 117*   Cardiac Enzymes: No results for input(s): CKTOTAL, CKMB, CKMBINDEX, TROPONINI in the last 168 hours. BNP (last 3 results) No results for input(s): BNP in the last 8760 hours.  ProBNP (last 3 results) No results for input(s): PROBNP in the last 8760 hours.  CBG:  Recent Labs Lab 08/19/15 1157 08/19/15 1640 08/19/15 2152 08/20/15 0728 08/20/15 1120  GLUCAP 207* 144* 152* 169* 202*    Recent Results (from the past 240 hour(s))  Culture, blood (routine x 2)     Status: None (Preliminary result)   Collection Time: 08/18/15  8:44 PM  Result Value Ref Range Status   Specimen Description BLOOD LEFT ANTECUBITAL  Final   Special Requests BOTTLES DRAWN AEROBIC ONLY 5CC  Final   Culture   Final    NO GROWTH < 12 HOURS Performed at [facility]    Report Status PENDING  Incomplete  Culture, blood (routine x 2)     Status: None (Preliminary result)   Collection Time: 08/18/15  8:45 PM  Result Value Ref Range Status   Specimen Description BLOOD RIGHT ANTECUBITAL  Final   Special Requests BOTTLES DRAWN AEROBIC ONLY 5CC  Final   Culture   Final    NO GROWTH < 12 HOURS Performed at [facility]    Report Status PENDING  Incomplete  C difficile quick scan w PCR reflex     Status: Abnormal   Collection Time: 08/18/15 10:10 PM  Result Value Ref Range Status   C Diff antigen RESULTS UNAVAILABLE DUE TO INTERFERING SUBSTANCE (A) NEGATIVE Final   C Diff toxin RESULTS UNAVAILABLE DUE TO INTERFERING SUBSTANCE (A) NEGATIVE Final   C Diff interpretation Results are indeterminate. See PCR results.  Final  Gastrointestinal Panel by PCR , Stool     Status: Abnormal   Collection Time: 08/18/15 10:10 PM  Result Value Ref Range Status   Campylobacter species DETECTED (A) NOT DETECTED  Final    Comment: CRITICAL RESULT CALLED TO, READ BACK BY AND VERIFIED WITH: SHANNON POTEAT AT 1533 ON 5217 BY CTJ.  CRITICAL RESULT CALLED TO, READ BACK BY AND VERIFIED WITH: SIERRA H.,RN @ 1603 ON [id number] BY POTEAT,S    Plesimonas shigelloides NOT DETECTED NOT DETECTED Final   Salmonella species NOT DETECTED NOT DETECTED Final   Yersinia enterocolitica NOT DETECTED NOT DETECTED Final   Vibrio species NOT DETECTED NOT DETECTED Final   Vibrio cholerae NOT DETECTED NOT DETECTED Final   Enteroaggregative E coli (EAEC) NOT DETECTED NOT DETECTED Final   Enteropathogenic E coli (EPEC) NOT DETECTED NOT DETECTED Final   Enterotoxigenic E coli (ETEC) NOT DETECTED NOT DETECTED Final   Shiga like toxin producing E coli (STEC) NOT DETECTED NOT DETECTED Final   E. coli O157 NOT DETECTED NOT DETECTED Final   Shigella/Enteroinvasive E coli (EIEC) NOT DETECTED NOT DETECTED Final   Cryptosporidium NOT DETECTED NOT DETECTED Final   Cyclospora cayetanensis NOT DETECTED NOT DETECTED  Final   Entamoeba histolytica NOT DETECTED NOT DETECTED Final   Giardia lamblia NOT DETECTED NOT DETECTED Final   Adenovirus F40/41 NOT DETECTED NOT DETECTED Final   Astrovirus NOT DETECTED NOT DETECTED Final   Norovirus GI/GII NOT DETECTED NOT DETECTED Final   Rotavirus A NOT DETECTED NOT DETECTED Final   Sapovirus (I, II, IV, and V) NOT DETECTED NOT DETECTED Final  Clostridium Difficile by PCR     Status: None   Collection Time: 08/18/15 10:10 PM  Result Value Ref Range Status   Toxigenic C Difficile by pcr NEGATIVE NEGATIVE Final    Comment: [facility]     Studies: Kore[place] Abdomen Complete  08/19/2015  CLINICAL DATA:  Elevated liver function tests. EXAM: ABDOMEN ULTRASOUND COMPLETE COMPARISON:  CT scan of April 30, 2011. FINDINGS: Gallbladder: No gallstones or wall thickening visualized. No sonographic Murphy sign noted by sonographer. Multiple polyps are noted with the largest measuring 9 mm. Mild  amount of sludge is noted within the gallbladder lumen. Common bile duct: Diameter: 4.2 mm which is within normal limits. Liver: Increased echogenicity is noted suggesting fatty infiltration with sparing around gallbladder fossa. IVC: No abnormality visualized. Pancreas: Visualized portion unremarkable. Spleen: Size and appearance within normal limits. Right Kidney: Length: 13.3 cm. Echogenicity within normal limits. No mass or hydronephrosis visualized. Left Kidney: Length: 13.6 cm. Echogenicity within normal limits. No mass or hydronephrosis visualized. Abdominal aorta: No aneurysm visualized. Other findings: None. IMPRESSION: Fatty infiltration of the liver. Sludge is noted within gallbladder lumen. Multiple polyps are noted in gallbladder with the largest measuring 9 mm. Followup ultrasound in 1 year is recommended to ensure stability and rule out neoplasm or malignancy. Electronically Signed   By: Lupita RaiderJames  Green Jr, M.D.   On: 08/19/2015 10:21   Dg Chest Port 1 View  08/18/2015  CLINICAL DATA:  Fever and body aches for 4 days. EXAM: PORTABLE CHEST 1 VIEW COMPARISON:  03/31/2012 FINDINGS: Examination is degraded due to patient body habitus and portable technique. Grossly unchanged borderline enlarged cardiac silhouette and mediastinal contours given persistently reduced lung volumes. No focal airspace opacities. No pleural effusion or pneumothorax. No evidence of edema. No acute osseus abnormalities. Post lower cervical ACDF, incompletely evaluated. IMPRESSION: No definite acute cardiopulmonary disease on this hypoventilated AP portable examination. Further evaluation with a PA and lateral chest radiograph may be obtained as clinically indicated. Electronically Signed   By: Simonne ComeJohn  Watts M.D.   On: 08/18/2015 21:01    Scheduled Meds: . doxycycline (VIBRAMYCIN) IV  100 mg Intravenous Q12H  . insulin aspart  0-9 Units Subcutaneous TID WC   Continuous Infusions:   Principal Problem:   SIRS (systemic  inflammatory response syndrome) (HCC) Active Problems:   Type 2 diabetes mellitus with hyperglycemia (HCC)   Thrombocytopenia (HCC)   Elevated LFTs   Sepsis due to undetermined organism (HCC)   Time spent: 25 minutes  Lorin GlassKaylie Gonze PA-S   [facility]  If 7PM-7AM, please contact night-coverage at [email or web address], password [email or web address] 08/20/2015, 1:05 PM  LOS: 2 days    Attending MD note  Patient was seen, examined,treatment plan was discussed with the PA-S.  I have personally reviewed the clinical findings, lab, imaging studies and management of this patient in detail. I agree with the documentation, as recorded by the PA-S.   Patient is doing much better, no further vomiting or diarrhea. His arthralgias and myalgias are significantly better.  On Exam: Gen. exam: Awake, alert, not in any distress Chest: Good  air entry bilaterally, no rhonchi or rales CVS: S1-S2 regular, no murmurs Abdomen: Soft, nontender and nondistended Neurology: Non-focal Skin: No rash or lesions  Impression No true if all of his symptoms can be explained by Campylobacter enteritis or he actually has a tickborne illness.  Plan Discontinue vancomycin and Zosyn, continue doxycycline. I will discuss with infectious disease and optimize antibiotic regimen.   Rest as above  [facility]

## 2015-08-21 LAB — GLUCOSE, CAPILLARY: GLUCOSE-CAPILLARY: 140 mg/dL — AB (ref 65–99)

## 2015-08-21 LAB — EPSTEIN BARR VRS(EBV DNA BY PCR)
EBV DNA QN BY PCR: NEGATIVE {copies}/mL
LOG10 EBV DNA QN PCR: UNDETERMINED {Log_copies}/mL

## 2015-08-21 LAB — CMV DNA, QUANTITATIVE, PCR
CMV DNA Quant: NEGATIVE IU/mL
Log10 CMV Qn DNA Pl: UNDETERMINED log10 IU/mL

## 2015-08-21 MED ORDER — DOXYCYCLINE HYCLATE 100 MG PO TABS
100.0000 mg | ORAL_TABLET | Freq: Two times a day (BID) | ORAL | Status: DC
  Administered 2015-08-21: 100 mg via ORAL
  Filled 2015-08-21 (×2): qty 1

## 2015-08-21 MED ORDER — DOXYCYCLINE HYCLATE 100 MG PO TABS: 100.0000 mg | ORAL_TABLET | Freq: Two times a day (BID) | ORAL | Status: AC

## 2015-08-21 MED ORDER — CIPROFLOXACIN HCL 500 MG PO TABS: 500.0000 mg | ORAL_TABLET | Freq: Two times a day (BID) | ORAL | Status: AC

## 2015-08-21 NOTE — Progress Notes (Signed)
PHARMACIST - PHYSICIAN COMMUNICATION DR:   TR[facility] CONCERNING: Antibiotic IV to Oral Route Change Policy  RECOMMENDATION: This patient is receiving doxycyline by the intravenous route.  Based on criteria approved by the Pharmacy and Therapeutics Committee, the antibiotic(s) is/are being converted to the equivalent oral dose form(s).   DESCRIPTION: These criteria include:  Patient being treated for a respiratory tract infection, urinary tract infection, cellulitis or clostridium difficile associated diarrhea if on metronidazole  The patient is not neutropenic and does not exhibit a GI malabsorption state  The patient is eating (either orally or via tube) and/or has been taking other orally administered medications for a least 24 hours  The patient is improving clinically and has a Tmax < 100.5  Ciprofloxacin already changed to PO   If you have questions about this conversion, please contact the Pharmacy Department  []   [phone]( [phone] )  [facility] []   [phone]( [phone] )  [facility] []   [phone]( [phone] )  [facility] []   [phone] )  [facility] [x]   [phone]( [phone] )  [facility]   Juliette Alcideustin Zeigler, PharmD, BCPS.   Pager: [phone] 08/21/2015 8:48 AM

## 2015-08-21 NOTE — Discharge Summary (Signed)
Physician Discharge Summary  Jeffrey Welch XBM:[id number] DOB: 10-25-1965 DOA: 08/18/2015  PCP: Martha ClanShaw, William, MD  Admit date: 08/18/2015 Discharge date: 08/21/2015  Time spent: 25 minutes minutes  Recommendations for Outpatient Follow-up:  1. Follow up with PCP in 1 week for repeat CBC/CMET 2. Complete 5 more doses of Ciprofloxacin (twice a day, starting tonight 5/4) 3. Complete 7 more doses of Doxycycline (twice a day, starting tonight 5/4)  Discharge Diagnoses:  Principal Problem:   SIRS (systemic inflammatory response syndrome) (HCC) Active Problems:   Type 2 diabetes mellitus with hyperglycemia (HCC)   Thrombocytopenia (HCC)   Elevated LFTs   Sepsis due to undetermined organism [facility](HCC)   Discharge Condition: Stable  Diet recommendation: Full  Filed Weights   08/18/15 1846 08/19/15 0500 08/20/15 0551  Weight: 103.7 kg (228 lb 9.9 oz) 107.8 kg (237 lb 10.5 oz) 103 kg (227 lb 1.2 oz)    History of present illness:  50 year old male with history DM and HLD presented with 1-week history of nausea, vomiting, diarrhea, and fevers. He had associated arthralgias and myalgias. Also gave a hx of pulling a tick from his abdominal wall on the evening of 08/14/2015.   Hospital Course:  Sepsis: UA, CXR, BCx negative. C.diff negative. GI pathogen positive for Campylobacter- received 2 days of Zosyn and will complete 3 days of PO ciprofloxacin (5 days of coverage total). History of tick exposure- RMSF, Ehrlicia, lyme all negative. Still have suspicion that some presenting symptoms related to a tickborne illness and serologies did not have time to form the antibodies. Received 3 days of IV doxycycline and will complete 4 days of PO doxycycline (7 days of coverage total). Sepsis physiology rapidly improved, vital signs stable at discharge. Likely due to combination of the above.  Thrombocytopenia: Likely due to above. Platelets improved from 60-117 during admission. Please repeat CBC at next visit  with PCP  Elevated LFTs: Acute hepatitis serology negative, abdominal US without obvious biliary abnormalities. Likely due to above. AST improved from 115-64, ALT improved from 161-91.  Please repeat LFT's at next visit with PCP  Diabetes Mellitus Type 2: Glucose remained stable during admission with SSI. Resume metformin.  Diarrhea: C.diff negative. Stool panel positive for Campylobacter. See above regarding antibiotics.  Hyponatremia: Resolved. Secondary to volume depletion and lack of PO intake.  Hypokalemia: Resolved.  Procedures:  None   Consultations:  None  Discharge Exam: Filed Vitals:   08/20/15 2139 08/21/15 0554  BP: 127/82 124/83  Pulse: 77 64  Temp: 98 F (36.7 C) 97.8 F (36.6 C)  Resp: 19 19    General: Awake, alert, NAD. Cardiovascular: RRR, normal S1 and S2 Respiratory: CTAB, no wheezes/crackles. Normal respiratory effort.  Discharge Instructions   Discharge Instructions    Call MD for:  extreme fatigue    Complete by:  As directed      Call MD for:  persistant dizziness or light-headedness    Complete by:  As directed      Call MD for:  persistant nausea and vomiting    Complete by:  As directed      Diet Carb Modified    Complete by:  As directed      Increase activity slowly    Complete by:  As directed           Current Discharge Medication List    START taking these medications   Details  ciprofloxacin (CIPRO) 500 MG tablet Take 1 tablet (500 mg total) by mouth 2 (two) times  daily. Qty: 5 tablet, Refills: 0    doxycycline (VIBRA-TABS) 100 MG tablet Take 1 tablet (100 mg total) by mouth every 12 (twelve) hours. Qty: 7 tablet, Refills: 0      CONTINUE these medications which have NOT CHANGED   Details  acetaminophen (TYLENOL) 500 MG tablet Take 1,000 mg by mouth every 4 (four) hours as needed for fever.    ibuprofen (ADVIL,MOTRIN) 200 MG tablet Take 400 mg by mouth every 4 (four) hours as needed for moderate pain. pain     Multiple Vitamins-Minerals (MULTIVITAMIN & MINERAL PO) Take 1 tablet by mouth daily.    metFORMIN (GLUCOPHAGE) 1000 MG tablet Take 500 mg by mouth 2 (two) times daily with a meal.       STOP taking these medications     traMADol (ULTRAM) 50 MG tablet        No Known Allergies Follow-up Information    Follow up with Martha Clan, MD In 1 week.   Specialty:  Internal Medicine   Why:  Hospital visit   Contact information:   [address] [place] [address] [phone]        The results of significant diagnostics from this hospitalization (including imaging, microbiology, ancillary and laboratory) are listed below for reference.    Significant Diagnostic Studies: US Abdomen Complete  08/19/2015  CLINICAL DATA:  Elevated liver function tests. EXAM: ABDOMEN ULTRASOUND COMPLETE COMPARISON:  CT scan of April 30, 2011. FINDINGS: Gallbladder: No gallstones or wall thickening visualized. No sonographic Murphy sign noted by sonographer. Multiple polyps are noted with the largest measuring 9 mm. Mild amount of sludge is noted within the gallbladder lumen. Common bile duct: Diameter: 4.2 mm which is within normal limits. Liver: Increased echogenicity is noted suggesting fatty infiltration with sparing around gallbladder fossa. IVC: No abnormality visualized. Pancreas: Visualized portion unremarkable. Spleen: Size and appearance within normal limits. Right Kidney: Length: 13.3 cm. Echogenicity within normal limits. No mass or hydronephrosis visualized. Left Kidney: Length: 13.6 cm. Echogenicity within normal limits. No mass or hydronephrosis visualized. Abdominal aorta: No aneurysm visualized. Other findings: None. IMPRESSION: Fatty infiltration of the liver. Sludge is noted within gallbladder lumen. Multiple polyps are noted in gallbladder with the largest measuring 9 mm. Followup ultrasound in 1 year is recommended to ensure stability and rule out neoplasm or malignancy. Electronically  Signed   By: Lupita Raider, M.D.   On: 08/19/2015 10:21   Dg Chest Port 1 View  08/18/2015  CLINICAL DATA:  Fever and body aches for 4 days. EXAM: PORTABLE CHEST 1 VIEW COMPARISON:  03/31/2012 FINDINGS: Examination is degraded due to patient body habitus and portable technique. Grossly unchanged borderline enlarged cardiac silhouette and mediastinal contours given persistently reduced lung volumes. No focal airspace opacities. No pleural effusion or pneumothorax. No evidence of edema. No acute osseus abnormalities. Post lower cervical ACDF, incompletely evaluated. IMPRESSION: No definite acute cardiopulmonary disease on this hypoventilated AP portable examination. Further evaluation with a PA and lateral chest radiograph may be obtained as clinically indicated. Electronically Signed   By: Simonne Come M.D.   On: 08/18/2015 21:01    Microbiology: Recent Results (from the past 240 hour(s))  Culture, blood (routine x 2)     Status: None (Preliminary result)   Collection Time: 08/18/15  8:44 PM  Result Value Ref Range Status   Specimen Description BLOOD LEFT ANTECUBITAL  Final   Special Requests BOTTLES DRAWN AEROBIC ONLY 5CC  Final   Culture   Final  NO GROWTH 2 DAYS Performed at [facility]    Report Status PENDING  Incomplete  Culture, blood (routine x 2)     Status: None (Preliminary result)   Collection Time: 08/18/15  8:45 PM  Result Value Ref Range Status   Specimen Description BLOOD RIGHT ANTECUBITAL  Final   Special Requests BOTTLES DRAWN AEROBIC ONLY 5CC  Final   Culture   Final    NO GROWTH 2 DAYS Performed at [facility]    Report Status PENDING  Incomplete  C difficile quick scan w PCR reflex     Status: Abnormal   Collection Time: 08/18/15 10:10 PM  Result Value Ref Range Status   C Diff antigen RESULTS UNAVAILABLE DUE TO INTERFERING SUBSTANCE (A) NEGATIVE Final   C Diff toxin RESULTS UNAVAILABLE DUE TO INTERFERING SUBSTANCE (A) NEGATIVE Final   C Diff  interpretation Results are indeterminate. See PCR results.  Final  Gastrointestinal Panel by PCR , Stool     Status: Abnormal   Collection Time: 08/18/15 10:10 PM  Result Value Ref Range Status   Campylobacter species DETECTED (A) NOT DETECTED Final    Comment: CRITICAL RESULT CALLED TO, READ BACK BY AND VERIFIED WITH: SHANNON POTEAT AT 1533 ON [id number] BY CTJ.  CRITICAL RESULT CALLED TO, READ BACK BY AND VERIFIED WITH: SIERRA H.,RN @ 1603 ON [id number] BY POTEAT,S    Plesimonas shigelloides NOT DETECTED NOT DETECTED Final   Salmonella species NOT DETECTED NOT DETECTED Final   Yersinia enterocolitica NOT DETECTED NOT DETECTED Final   Vibrio species NOT DETECTED NOT DETECTED Final   Vibrio cholerae NOT DETECTED NOT DETECTED Final   Enteroaggregative E coli (EAEC) NOT DETECTED NOT DETECTED Final   Enteropathogenic E coli (EPEC) NOT DETECTED NOT DETECTED Final   Enterotoxigenic E coli (ETEC) NOT DETECTED NOT DETECTED Final   Shiga like toxin producing E coli (STEC) NOT DETECTED NOT DETECTED Final   E. coli O157 NOT DETECTED NOT DETECTED Final   Shigella/Enteroinvasive E coli (EIEC) NOT DETECTED NOT DETECTED Final   Cryptosporidium NOT DETECTED NOT DETECTED Final   Cyclospora cayetanensis NOT DETECTED NOT DETECTED Final   Entamoeba histolytica NOT DETECTED NOT DETECTED Final   Giardia lamblia NOT DETECTED NOT DETECTED Final   Adenovirus F40/41 NOT DETECTED NOT DETECTED Final   Astrovirus NOT DETECTED NOT DETECTED Final   Norovirus GI/GII NOT DETECTED NOT DETECTED Final   Rotavirus A NOT DETECTED NOT DETECTED Final   Sapovirus (I, II, IV, and V) NOT DETECTED NOT DETECTED Final  Clostridium Difficile by PCR     Status: None   Collection Time: 08/18/15 10:10 PM  Result Value Ref Range Status   Toxigenic C Difficile by pcr NEGATIVE NEGATIVE Final    Comment: Performed at [facility]     Labs: Basic Metabolic Panel:  Recent Labs Lab 08/18/15 2044 08/19/15 0428 08/20/15 0530  NA  129* 134* 140  K 3.7 3.5 3.2*  CL 91* 100* 104  CO2 27 27 29   GLUCOSE 216* 167* 171*  BUN 14 10 7   CREATININE 1.02 0.88 0.86  CALCIUM 8.8* 7.9* 8.5*   Liver Function Tests:  Recent Labs Lab 08/18/15 2044 08/19/15 0428 08/20/15 0530  AST 115* 77* 64*  ALT 161* 116* 91*  ALKPHOS 83 69 71  BILITOT 1.4* 1.3* 1.0  PROT 6.5 5.6* 5.5*  ALBUMIN 3.4* 2.9* 2.8*   No results for input(s): LIPASE, AMYLASE in the last 168 hours. No results for input(s): AMMONIA in the  last 168 hours. CBC:  Recent Labs Lab 08/18/15 2044 08/19/15 0428 08/20/15 0530  WBC 8.0 8.2 7.9  NEUTROABS 5.3 4.3 2.3  HGB 16.1 13.5 13.0  HCT 43.8 37.6* 37.4*  MCV 84.6 85.5 87.4  PLT 60* 72* 117*   Cardiac Enzymes: No results for input(s): CKTOTAL, CKMB, CKMBINDEX, TROPONINI in the last 168 hours. BNP: BNP (last 3 results) No results for input(s): BNP in the last 8760 hours.  ProBNP (last 3 results) No results for input(s): PROBNP in the last 8760 hours.  CBG:  Recent Labs Lab 08/20/15 0728 08/20/15 1120 08/20/15 1658 08/20/15 2142 08/21/15 0731  GLUCAP 169* 202* 182* 161* 140*    Signed:  Lorin Glass PA-S   [facility] 08/21/2015, 12:12 PM  Attending MD note  Patient was seen, examined,treatment plan was discussed with the PA-S.  I have personally reviewed the clinical findings, lab, imaging studies and management of this patient in detail. I agree with the documentation, as recorded by the PA-S.   Patient is doing much better-arthralgias/myalgias are much better. Vomiting has resolved, has mild lingering diarrhea that is improving as well.   On Exam: Gen. exam: Awake, alert, not in any distress Chest: Good air entry bilaterally, no rhonchi or rales CVS: S1-S2 regular, no murmurs Abdomen: Soft, nontender and nondistended Neurology: Non-focal Skin: No rash or lesions  Impression Suspected Tick Borne Illness ?Campylobacter enteritis  Plan Since improved-stable for  discharge with oral doxy and cipro Have asked patient to keep himself hydrated and follow with PCP in 1 week.  Rest as above  [facility]

## 2015-08-21 NOTE — Progress Notes (Signed)
Discharge home, wife at bedside.D/c instructions and follow up appointments done and discussed with patient verbalized understanding. Printout about Campylobacter was also provided. CDC  Questionaires[facility] works [facility] [place]

## 2015-08-23 LAB — CULTURE, BLOOD (ROUTINE X 2)
CULTURE: NO GROWTH
CULTURE: NO GROWTH

## 2021-05-22 ENCOUNTER — Other Ambulatory Visit: Payer: Self-pay | Admitting: Orthopedic Surgery

## 2021-05-22 DIAGNOSIS — M4326 Fusion of spine, lumbar region: Secondary | ICD-10-CM

## 2021-06-15 ENCOUNTER — Ambulatory Visit
Admission: RE | Admit: 2021-06-15 | Discharge: 2021-06-15 | Disposition: A | Discharge status: Home / Self Care | Payer: Self-pay | Source: Ambulatory Visit | Attending: Orthopedic Surgery | Admitting: Orthopedic Surgery

## 2021-06-15 ENCOUNTER — Other Ambulatory Visit: Payer: Self-pay

## 2021-06-15 DIAGNOSIS — M4326 Fusion of spine, lumbar region: Secondary | ICD-10-CM

## 2022-03-11 IMAGING — CT CT L SPINE W/O CM
1 of 8 series · 5 of 14 positions shown, 7 images · non-contrast
Comparison: Correlation made with lumbar spine MRI 0139

CLINICAL DATA: Fusion of lumbar spine; low back pain into left leg



[Series 3: l spine soft · axial · 0.40mm/px · z∈[-274,-102]mm · 5 of 130 slices shown, 7 images]
[im 22/130  soft-tissue]
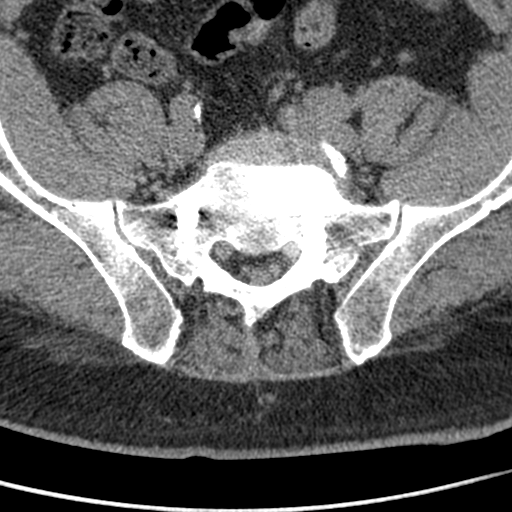
[im 22/130  bone]
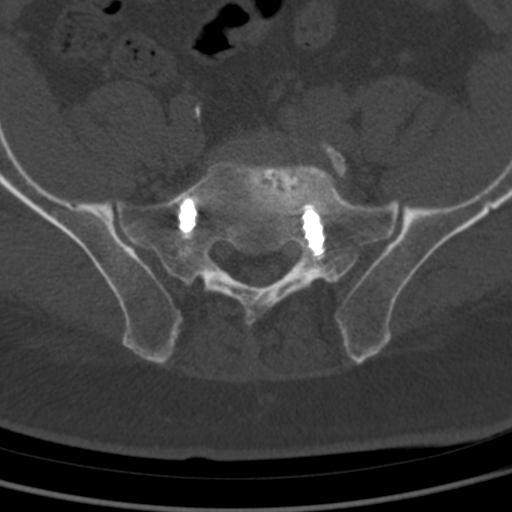
[im 44/130  bone]
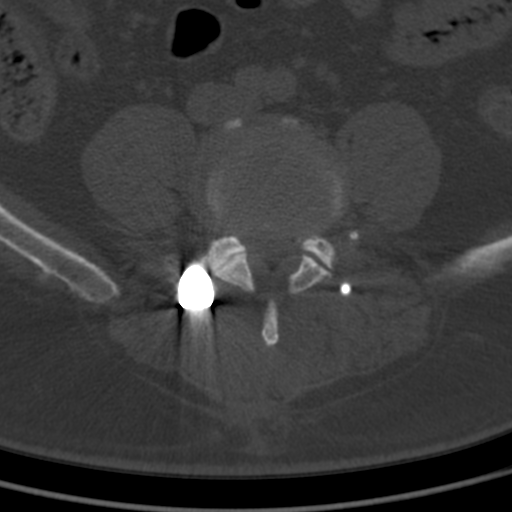
[im 65/130  bone]
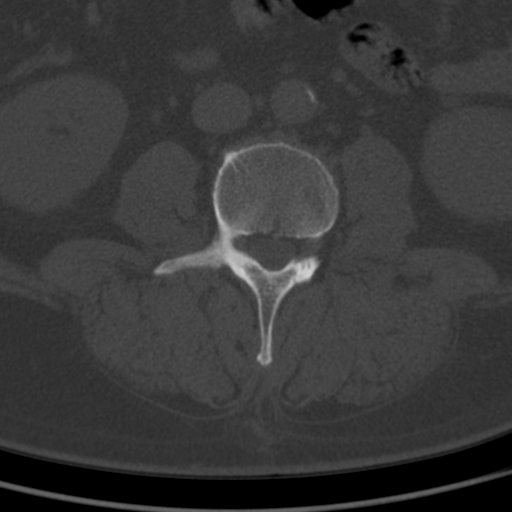
[im 87/130  bone]
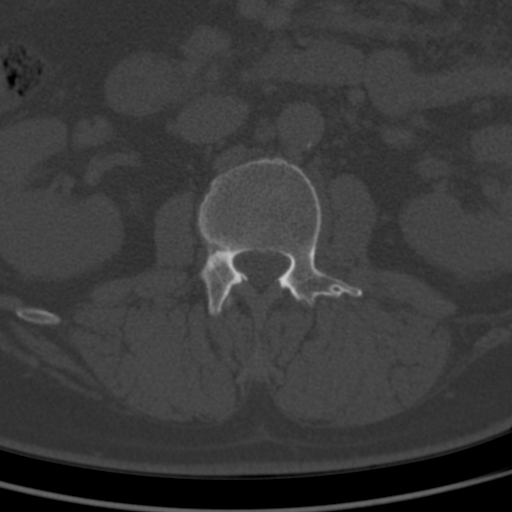
[im 108/130  soft-tissue]
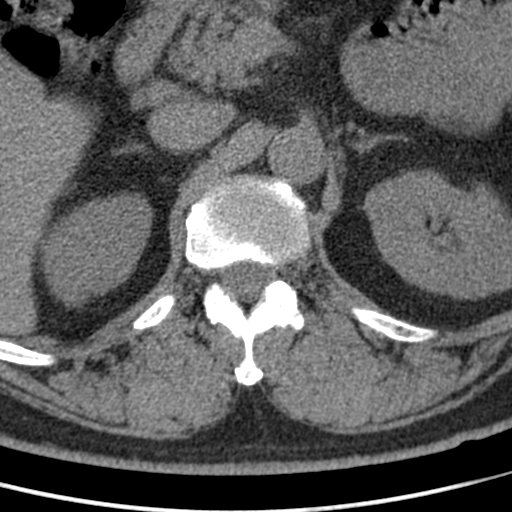
[im 108/130  bone]
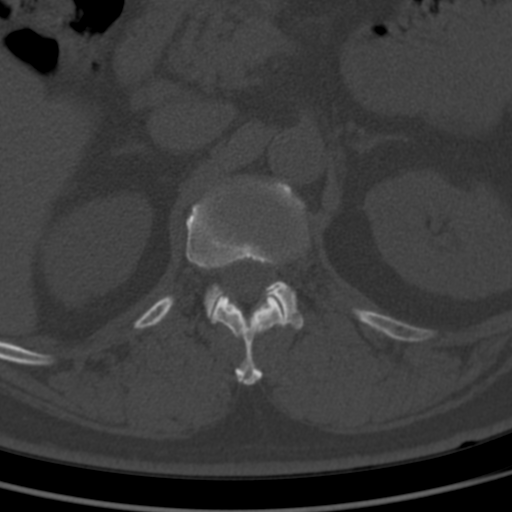

[5 of 14 positions shown; findings below may reference images not displayed]

FINDINGS: Segmentation: 5 lumbar type vertebrae.

Alignment: No significant listhesis.

Vertebrae: Fusion at L5-S1 with rods and pedicle screws and
interbody spacer. No evidence of screw loosening. Vertebral body
heights are maintained. No destructive osseous lesion.

Paraspinal and other soft tissues: Chronic postoperative changes.
Mild aortic atherosclerosis.

Disc levels:

L1-L2: Facet hypertrophy. No significant canal or foraminal
stenosis.

L2-L3: Disc bulge. Facet hypertrophy and ligamentum flavum
thickening. No significant canal or foraminal stenosis.

L3-L4: Disc bulge with endplate osteophytic ridging. Facet
hypertrophy and ligamentum flavum thickening. Mild prominence of
dorsal epidural fat. Probable mild to moderate canal stenosis with
narrowing of subarticular recesses. No significant foraminal
stenosis.

L4-L5: Disc bulge. Facet hypertrophy with ligamentum flavum
thickening. Probable mild canal stenosis. Mild foraminal stenosis.

L5-S1: Operative level. Streak artifact precludes evaluation of the
canal. Endplate osteophytes are present likely encroaching on the
neural foramina.
IMPRESSION: Prior L5-S1 fusion without evidence of hardware complication.

Degenerative changes as detailed above. Narrowing of subarticular
recesses at L3-L4. Endplate osteophytes at L5-S1 likely encroach on
the neural foramina.
